# Patient Record
Sex: Male | Born: 1955 | Race: White | Hispanic: No | Marital: Married | Smoking: Former smoker
Health system: Southern US, Community
[De-identification: ages and names within clinical notes are randomized; demographics above are authoritative.]

## PROBLEM LIST (undated history)

## (undated) HISTORY — PX: APPENDECTOMY: SHX54

## (undated) HISTORY — PX: CHOLECYSTECTOMY: SHX55

## (undated) HISTORY — PX: KNEE SURGERY: SHX244

## (undated) HISTORY — PX: ROTATOR CUFF REPAIR: SHX139

---

## 1998-02-10 ENCOUNTER — Emergency Department (HOSPITAL_COMMUNITY): Admission: EM | Admit: 1998-02-10 | Discharge: 1998-02-10 | Payer: Self-pay | Admitting: Emergency Medicine

## 1999-10-28 ENCOUNTER — Encounter: Payer: Self-pay | Admitting: Emergency Medicine

## 1999-10-28 ENCOUNTER — Emergency Department (HOSPITAL_COMMUNITY): Admission: EM | Admit: 1999-10-28 | Discharge: 1999-10-28 | Payer: Self-pay | Admitting: Emergency Medicine

## 2000-04-19 ENCOUNTER — Emergency Department (HOSPITAL_COMMUNITY): Admission: EM | Admit: 2000-04-19 | Discharge: 2000-04-19 | Payer: Self-pay | Admitting: Emergency Medicine

## 2000-10-06 ENCOUNTER — Encounter: Payer: Self-pay | Admitting: Emergency Medicine

## 2000-10-06 ENCOUNTER — Emergency Department (HOSPITAL_COMMUNITY): Admission: EM | Admit: 2000-10-06 | Discharge: 2000-10-06 | Payer: Self-pay | Admitting: Emergency Medicine

## 2001-02-08 ENCOUNTER — Emergency Department (HOSPITAL_COMMUNITY): Admission: EM | Admit: 2001-02-08 | Discharge: 2001-02-08 | Payer: Self-pay | Admitting: Emergency Medicine

## 2001-02-10 ENCOUNTER — Emergency Department (HOSPITAL_COMMUNITY): Admission: EM | Admit: 2001-02-10 | Discharge: 2001-02-10 | Payer: Self-pay | Admitting: Emergency Medicine

## 2001-10-26 ENCOUNTER — Emergency Department (HOSPITAL_COMMUNITY): Admission: EM | Admit: 2001-10-26 | Discharge: 2001-10-26 | Payer: Self-pay

## 2001-12-14 ENCOUNTER — Emergency Department (HOSPITAL_COMMUNITY): Admission: EM | Admit: 2001-12-14 | Discharge: 2001-12-14 | Payer: Self-pay | Admitting: Emergency Medicine

## 2002-08-08 ENCOUNTER — Emergency Department (HOSPITAL_COMMUNITY): Admission: EM | Admit: 2002-08-08 | Discharge: 2002-08-08 | Payer: Self-pay | Admitting: Emergency Medicine

## 2002-08-29 ENCOUNTER — Encounter: Payer: Self-pay | Admitting: Emergency Medicine

## 2002-08-29 ENCOUNTER — Emergency Department (HOSPITAL_COMMUNITY): Admission: EM | Admit: 2002-08-29 | Discharge: 2002-08-29 | Payer: Self-pay | Admitting: Emergency Medicine

## 2003-03-06 ENCOUNTER — Encounter: Payer: Self-pay | Admitting: Gastroenterology

## 2003-03-06 ENCOUNTER — Ambulatory Visit (HOSPITAL_COMMUNITY): Admission: RE | Admit: 2003-03-06 | Discharge: 2003-03-06 | Payer: Self-pay | Admitting: Gastroenterology

## 2003-04-03 ENCOUNTER — Encounter: Payer: Self-pay | Admitting: Emergency Medicine

## 2003-04-03 ENCOUNTER — Emergency Department (HOSPITAL_COMMUNITY): Admission: EM | Admit: 2003-04-03 | Discharge: 2003-04-03 | Payer: Self-pay | Admitting: Emergency Medicine

## 2003-04-07 ENCOUNTER — Encounter: Payer: Self-pay | Admitting: Internal Medicine

## 2003-04-07 ENCOUNTER — Inpatient Hospital Stay (HOSPITAL_COMMUNITY): Admission: AD | Admit: 2003-04-07 | Discharge: 2003-04-10 | Payer: Self-pay | Admitting: Gastroenterology

## 2003-04-08 ENCOUNTER — Encounter (INDEPENDENT_AMBULATORY_CARE_PROVIDER_SITE_OTHER): Payer: Self-pay | Admitting: Specialist

## 2003-10-22 ENCOUNTER — Ambulatory Visit (HOSPITAL_COMMUNITY): Admission: RE | Admit: 2003-10-22 | Discharge: 2003-10-22 | Payer: Self-pay | Admitting: Gastroenterology

## 2003-12-17 ENCOUNTER — Ambulatory Visit (HOSPITAL_COMMUNITY): Admission: RE | Admit: 2003-12-17 | Discharge: 2003-12-17 | Payer: Self-pay | Admitting: Neurology

## 2004-08-04 ENCOUNTER — Ambulatory Visit (HOSPITAL_BASED_OUTPATIENT_CLINIC_OR_DEPARTMENT_OTHER): Admission: RE | Admit: 2004-08-04 | Discharge: 2004-08-04 | Payer: Self-pay | Admitting: Urology

## 2005-01-01 ENCOUNTER — Emergency Department (HOSPITAL_COMMUNITY): Admission: EM | Admit: 2005-01-01 | Discharge: 2005-01-02 | Payer: Self-pay | Admitting: Emergency Medicine

## 2005-09-14 ENCOUNTER — Emergency Department (HOSPITAL_COMMUNITY): Admission: EM | Admit: 2005-09-14 | Discharge: 2005-09-14 | Payer: Self-pay | Admitting: Emergency Medicine

## 2006-01-23 ENCOUNTER — Emergency Department (HOSPITAL_COMMUNITY): Admission: EM | Admit: 2006-01-23 | Discharge: 2006-01-23 | Payer: Self-pay | Admitting: Emergency Medicine

## 2006-05-31 ENCOUNTER — Emergency Department (HOSPITAL_COMMUNITY): Admission: EM | Admit: 2006-05-31 | Discharge: 2006-05-31 | Payer: Self-pay | Admitting: Family Medicine

## 2006-06-07 ENCOUNTER — Ambulatory Visit: Payer: Self-pay | Admitting: Family Medicine

## 2006-06-07 ENCOUNTER — Encounter: Admission: RE | Admit: 2006-06-07 | Discharge: 2006-06-07 | Payer: Self-pay | Admitting: Family Medicine

## 2006-07-26 ENCOUNTER — Ambulatory Visit: Payer: Self-pay | Admitting: Family Medicine

## 2006-08-29 ENCOUNTER — Ambulatory Visit (HOSPITAL_COMMUNITY): Admission: RE | Admit: 2006-08-29 | Discharge: 2006-08-29 | Payer: Self-pay | Admitting: Gastroenterology

## 2006-08-30 ENCOUNTER — Encounter (INDEPENDENT_AMBULATORY_CARE_PROVIDER_SITE_OTHER): Payer: Self-pay | Admitting: Specialist

## 2006-08-30 ENCOUNTER — Ambulatory Visit (HOSPITAL_COMMUNITY): Admission: RE | Admit: 2006-08-30 | Discharge: 2006-08-30 | Payer: Self-pay | Admitting: Gastroenterology

## 2006-10-09 ENCOUNTER — Encounter: Admission: RE | Admit: 2006-10-09 | Discharge: 2006-10-09 | Payer: Self-pay | Admitting: Gastroenterology

## 2006-11-27 ENCOUNTER — Ambulatory Visit: Payer: Self-pay | Admitting: Family Medicine

## 2007-03-16 ENCOUNTER — Emergency Department (HOSPITAL_COMMUNITY): Admission: EM | Admit: 2007-03-16 | Discharge: 2007-03-16 | Payer: Self-pay | Admitting: Emergency Medicine

## 2007-03-23 ENCOUNTER — Emergency Department (HOSPITAL_COMMUNITY): Admission: EM | Admit: 2007-03-23 | Discharge: 2007-03-23 | Payer: Self-pay | Admitting: Family Medicine

## 2008-04-02 ENCOUNTER — Emergency Department (HOSPITAL_COMMUNITY): Admission: EM | Admit: 2008-04-02 | Discharge: 2008-04-02 | Payer: Self-pay | Admitting: Family Medicine

## 2008-04-08 ENCOUNTER — Emergency Department (HOSPITAL_COMMUNITY): Admission: EM | Admit: 2008-04-08 | Discharge: 2008-04-08 | Payer: Self-pay | Admitting: Emergency Medicine

## 2009-04-02 ENCOUNTER — Emergency Department (HOSPITAL_COMMUNITY): Admission: EM | Admit: 2009-04-02 | Discharge: 2009-04-02 | Payer: Self-pay | Admitting: Emergency Medicine

## 2010-11-03 ENCOUNTER — Emergency Department (HOSPITAL_COMMUNITY)
Admission: EM | Admit: 2010-11-03 | Discharge: 2010-11-03 | Payer: Self-pay | Source: Home / Self Care | Admitting: Emergency Medicine

## 2011-01-17 LAB — POCT I-STAT, CHEM 8
BUN: 21 mg/dL (ref 6–23)
Chloride: 102 mEq/L (ref 96–112)
Creatinine, Ser: 1.1 mg/dL (ref 0.4–1.5)
Glucose, Bld: 98 mg/dL (ref 70–99)
Potassium: 4.2 mEq/L (ref 3.5–5.1)

## 2011-01-17 LAB — POCT URINALYSIS DIP (DEVICE)
Bilirubin Urine: NEGATIVE
Ketones, ur: 15 mg/dL — AB
Specific Gravity, Urine: 1.02 (ref 1.005–1.030)
pH: 5.5 (ref 5.0–8.0)

## 2011-02-25 NOTE — Op Note (Signed)
NAME:  Leonard Rowe, Leonard Rowe                        ACCOUNT NO.:  [id number]   MEDICAL RECORD NO.:  [id number]                   PATIENT TYPE:  AMB   LOCATION:  ENDO                                 FACILITY:  [facility]   PHYSICIAN:  Anselmo Rod, M.D.               DATE OF BIRTH:  1956-07-16   DATE OF PROCEDURE:  10/22/2003  DATE OF DISCHARGE:                                 OPERATIVE REPORT   PROCEDURE:  Colonoscopy.   ENDOSCOPIST:  Anselmo Rod, M.D.   INSTRUMENT USED:  Olympus video colonoscope.   INDICATIONS FOR PROCEDURE:  The patient is a 55 year old white male with a  history of rectal bleeding.  Rule out colonic polyps, masses, etc.   PRE-PROCEDURE PREPARATION:  Informed consent was procured from the patient.  The patient fasted for eight hours prior to the procedure and was prepped  with a bottle of magnesium citrate and a gallon of GoLYTELY the night prior  to the procedure.   PRE-PROCEDURE PHYSICAL:  VITAL SIGNS:  Stable.  NECK:  Supple.  CHEST:  Clear to auscultation.  ABDOMEN:  Soft with normal bowel sounds.   DESCRIPTION OF PROCEDURE:  The patient was placed in the left lateral  decubitus position and sedated with 100 mcg of Fentanyl and 10 mg of Versed  intravenously.  Once the patient was adequately sedated and maintained on  low flow oxygen and continuous cardiac monitoring, the Olympus video  colonoscope was advanced from the rectum to the cecum.  The appendiceal  orifice and ileocecal valve were clearly visualized and were normal to the  terminal ileum which appeared normal without lesions.  No masses, polyps,  erosions, ulcerations or diverticula were seen.  Small internal hemorrhoids  were recognized on retroflexion in the rectum.  The patient tolerated the  procedure well without complications.   IMPRESSION:  Normal colonoscopy up to the terminal ileum except for small  non-bleeding internal hemorrhoids.   RECOMMENDATIONS:  1. Continue on high fiber diet  with liberal fluid intake.  2. P.r.n. Phenergan for nausea and vomiting.  3. Outpatient follow-up in the next two weeks for further recommendations.  4. Repeat colorectal cancer screening is recommended in the next ten years     unless the patient develops any abnormal symptoms in the interim.                                               Anselmo Rod, M.D.    JNM/MEDQ  D:  10/22/2003  T:  10/22/2003  Job:  [id number]   cc:   Gabriel Earing, M.D.  [address]  [place]  [place] [address]  Fax: [phone]

## 2011-02-25 NOTE — Op Note (Signed)
NAME:  Leonard Rowe, Leonard Rowe              ACCOUNT NO.:  [id number]   MEDICAL RECORD NO.:  [id number]          PATIENT TYPE:  AMB   LOCATION:  NESC                         FACILITY:  [facility]   PHYSICIAN:  Maretta Bees. Vonita Moss, M.D.DATE OF BIRTH:  08/23/1956   DATE OF PROCEDURE:  08/04/2004  DATE OF DISCHARGE:                                 OPERATIVE REPORT   PREOPERATIVE DIAGNOSES:  Right spermatocele.   POSTOPERATIVE DIAGNOSES:  Right spermatocele.   PROCEDURES:  1.  Scrotal exploration.  2.  Right spermatocelectomy.  3.  Resection of tunica vaginalis.   SURGEON:  Dr. Larey Dresser   ANESTHESIA:  General.   INDICATIONS:  This 55 year old gentleman has had a tender, sore, cystic mass  in the right scrotum that was apparently excised in [place] last year,  and he did well for several months, and now he has a recurrent cystic lesion  documented on ultrasound as measuring 2.8 x 2.0 x 4.2 cm, located above the  right testicle.  Testicles were normal on ultrasound.  This area was very  tender, and the patient wished excision.   DESCRIPTION OF PROCEDURE:  The patient brought to the operating room and  placed in supine position.  The external genitalia were prepped and draped  in the usual fashion.  A vertical right scrotal incision was made, and there  was mild scarring from previous surgery, but I was able to dissect out this  cystic mass above the right testicle and isolate it and resect it  completely.  It had quite typical appearance of a spermatocele.  A small  residual base was fulgurated to prevent recurrence.  I then dissected a very  tightly adherent tunica vaginalis on the testicle to prevent future  formation of a hydrocele.  At this point, a couple of bleeders were tied off  with 3-0 chromic catgut.  The small bleeders were fulgurated, and there was  good hemostasis with essentially no blood loss.  The testicle was placed  back in the scrotum and scrotal incision closed in two  layers with running 3-  0 chromic catgut.  The wound was cleaned and dressed with dry sterile gauze  dressings.  He was taken to the recovery room in good condition having  tolerated the procedure well with a correct sponge, needle, and instrument  count.      LJP/MEDQ  D:  08/04/2004  T:  08/04/2004  Job:  [id number]

## 2011-02-25 NOTE — Op Note (Signed)
NAME:  Leonard Rowe, Leonard Rowe                        ACCOUNT NO.:  [id number]   MEDICAL RECORD NO.:  [id number]                   PATIENT TYPE:  OBV   LOCATION:  5710                                 FACILITY:  [facility]   PHYSICIAN:  Jimmye Norman III, M.D.               DATE OF BIRTH:  09/07/1956   DATE OF PROCEDURE:  04/08/2003  DATE OF DISCHARGE:                                 OPERATIVE REPORT   PREOPERATIVE DIAGNOSES:  Right upper quadrant pain, possible chronic  cholecystitis.   POSTOPERATIVE DIAGNOSES:  Right upper quadrant pain, possible chronic  cholecystitis.   OPERATION PERFORMED:  1. Laparoscopic cholecystectomy.  2. Liver biopsy.   SURGEON:  Marta Lamas. Lindie Spruce, M.D.   ASSISTANT:  Gabrielle Dare. Janee Morn, M.D.   ANESTHESIA:  General endotracheal.   ESTIMATED BLOOD LOSS:  Less than 20mL.   COMPLICATIONS:  None.   CONDITION:  Stable.   SPECIMENS:  1. Gallbladder.  2. Liver biopsy.   INDICATIONS FOR PROCEDURE:  The patient is a 55 year old with chronic right  upper quadrant abdominal pain for several months now after an extensive and  exhaustive work-up is coming for a laparoscopic cholecystectomy.   OPERATIVE FINDINGS:  The patient has adhesions towards the gallbladder bed  and the right lobe of the liver.  He also has some scarring in the liver  just above the gallbladder fossa.  Biopsies were taken.   DESCRIPTION OF PROCEDURE:  The patient was taken to the operating room and  placed on the table in the supine position.  After an adequate endotracheal  anesthetic was administered, the patient was prepped and draped in the usual  sterile manner exposing the midline and the right upper quadrant.   A supraumbilical curvilinear incision was made using a #11 blade and taken  down to the midline fascia.  It was through this fascia that a Veress needle  was passed into the peritoneal cavity and confirmed to be in position using  the saline test.  Once this was done, carbon  dioxide insufflation up to a  maximum intra-abdominal pressure of 15 mmHg was passed into the peritoneal  cavity.  Once this was done, 11-12 mm cannula and two right costal margin 5  mm cannulas were passed under direct vision.  A subxiphoid cannula was also  passed.  Once this was done, the patient was placed in steep reverse  Trendelenburg position, the left side tilted down and the gallbladder  dissection begun.   It was noted immediately that the patient had some scarring above the  gallbladder fossa.  In a standard manner, we grasped the gallbladder and the  infundibulum with ratcheted grasper through the right costal margin  cannulas.  We dissected out the perineum overlying the triangle of Calot and  the hepatoduodenal triangle.  The cystic duct was noted to be extremely  small, less than 2 mm in size and did not feel as  though it was safe to  cannulate.  It was too small for __________ catheter and therefore, no  attempt was made to cannulate it.  We ligated it doubly distally and singly  proximally and then subsequently transected it.  The cystic artery was  subsequently found and ligated with Endoclips  We then dissected the  gallbladder out of its bed with minimal difficulty.  As we got to the edge  of the liver, we took out a wedge biopsy of the liver edge with the scarring  contained within and placed it in the EndoCatch bag which was subsequently  brought out through the supraumbilical fascia.   Once the gallbladder was removed with minimal difficulty, we allowed all gas  to escape through the cannula sites and also aspirated along the right upper  quadrant.  Once this was done, we closed the supraumbilical fascia using a  figure-of-eight stitch of 0 Vicryl.  Once this was done, the skin at all  sites was closed using running subcuticular stitch of 4-0 Vicryl.  Steri-  Strips were applied to all wounds.  0.25% Marcaine with epinephrine was  injected at all sites.  All  sponge, needle and instrument counts were  correct.                                                 [name], M.D.    JW/MEDQ  D:  04/08/2003  T:  04/08/2003  Job:  [id number]   cc:   Anselmo Rod, M.D.  [address]  [address]  [place]  [place] [address]  Fax: [phone]   C. Ulyess Mort, M.D.  [address]  [place]  [place] [address]  Fax: [phone]

## 2011-02-25 NOTE — Discharge Summary (Signed)
NAME:  Leonard Rowe, Leonard Rowe                        ACCOUNT NO.:  [id number]   MEDICAL RECORD NO.:  [id number]                   PATIENT TYPE:  INP   LOCATION:  5710                                 FACILITY:  [facility]   PHYSICIAN:  Jimmye Norman III, M.D.               DATE OF BIRTH:  01-28-1956   DATE OF ADMISSION:  04/07/2003  DATE OF DISCHARGE:  04/10/2003                                 DISCHARGE SUMMARY   DISCHARGE DIAGNOSIS:  Abnormal scarring of the liver, unknown etiology with  normal gallbladder status post laparoscopic cholecystectomy and persists  right upper quadrant pain.   PRINCIPAL PROCEDURES:  1. Upper GI endoscopy.  2. Laparoscopic cholecystectomy.   DISCHARGE MEDICATIONS:  He is discharged home with Percocet p.r.n. for pain.   FOLLOW UP:  See me in 2 weeks.   DIET:  Unrestricted.   CONDITION:  Good.   BRIEF SUMMARY OF HOSPITAL COURSE:  The patient was reviewed to me by Dr.  Loreta Ave who had concerns for this patient having a severe abdominal pain in the  right upper quadrant causing nausea, vomiting, and just chronic illness.  He  had had a normal HIDA scan on 2 different occasions with no evidence of  common bile duct obstruction or abnormal contraction of the gallbladder.  However, his symptoms were very close to what a person would have with an  abnormal gallbladder with right upper quadrant postprandial pain associated  with nausea, vomiting, bloating, and gaseousness.   Based on his symptoms primarily, we elected to go ahead with a laparoscopic  cholecystectomy.  This was done.  However, the patient did get a  preoperative upper GI endoscopy, which was normal.   Laparoscopically, the patient's gallbladder was taken out and was found to  be normal pathologically, but he did have some abnormal scarring in and near  the gallbladder fossa.  He also had some adhesions to the gallbladder fossa  and the gallbladder, which were taken down.  In spite of this, it did not  seem to relieve the patient's preoperative pain.  However, he did well from  the surgery and was discharged home.   Of note, his bilirubin, which had been slightly increased on several  occasions preoperative up to 1.8 also went up to 1.8 postoperative, but his  alkaline phosphatase and other LFTs were normal.   I have discharged him home.  Come back to see me in 2 weeks.                                                [name], M.D.    JW/MEDQ  D:  04/17/2003  T:  04/18/2003  Job:  [id number]   cc:   Anselmo Rod, M.D.  [address]  [address]  [place]  [place] [address]  Fax: [phone]   C. Ulyess Mort, M.D.  [address]  [place]  [place] [address]  Fax: [phone]    cc:   Anselmo Rod, M.D.  [address]  [address]  [place]  [place] [address]  Fax: [phone]   C. Ulyess Mort, M.D.  [address]  [place]  [place] [address]  Fax: [phone]

## 2011-02-25 NOTE — Op Note (Signed)
NAME:  Leonard Rowe, Leonard Rowe                        ACCOUNT NO.:  [id number]   MEDICAL RECORD NO.:  [id number]                   PATIENT TYPE:  OBV   LOCATION:  5710                                 FACILITY:  [facility]   PHYSICIAN:  Anselmo Rod, M.D.               DATE OF BIRTH:  [birth year]   DATE OF PROCEDURE:  04/07/2003  DATE OF DISCHARGE:                                 OPERATIVE REPORT   PROCEDURE PERFORMED:  Esophagogastroduodenoscopy.   ENDOSCOPIST:  Anselmo Rod, M.D.   INSTRUMENT USED:  Olympus video panendoscopy   INDICATIONS FOR PROCEDURE:  Severe epigastric and right upper quadrant pain  in a 55 year old white male with a normal abdominal ultrasound and a HIDA  scan revealing EF of 54%, rule out peptic ulcer disease, esophagitis,  gastritis, etc.  The patient has a history of nonsteroidal use including  Goody's powders.   PREPROCEDURE PREPARATION:  Informed consent was procured from the patient.  The patient was fasted for eight hours prior to the procedure.   PREPROCEDURE PHYSICAL:  VITAL SIGNS:  The patient had stable vital signs.  NECK:  Supple.  CHEST:  Clear to auscultation.  S1 and S2 regular.  ABDOMEN:  Soft with normal bowel sounds.  Significant epigastric and right  upper quadrant tenderness on palpation with regarding.  No rebound or  hepatosplenomegaly.   DESCRIPTION OF PROCEDURE:  The patient was placed in the left lateral  decubitus position and sedated with 75 mcg of Fentanyl and 6 mg of Versed  intravenously.  Once the patient was adequately sedated and maintained on  low flow oxygen and continuous cardiac monitoring, the Olympus video  panendoscope was advanced through the mouth piece over the tongue into the  esophagus under direct vision.  The entire esophagus appeared normal with no  evidence of ring, stricture, masses, esophagitis or Barrett's mucosa.  The  scope was then advanced into the stomach.  The gastric mucosa appeared  normal except  for a small subcutaneous nodule noticed in the antrum.  Attempts were made to biopsy this region but it seemed to slip below the  overlying gastric mucosa and therefore this area was not biopsied.  Retroflexion of the high cardia revealed no abnormalities.  The duodenal  bulb and the proximal wall distal to the bulb appeared normal.  There was no  outlet obstruction.   IMPRESSION:  Normal esophagogastroduodenoscopy except for small submucosal  nodule in antrum of unclear significance.   RECOMMENDATIONS:  1. Surgical evaluation by Dr. Lindie Spruce for possible laparoscopic     cholecystectomy.  2. Continue PPI.  3. Avoid all nonsteroidals.  4. Further recommendations will be made as per the teaching service.  Anselmo Rod, M.D.    JNM/MEDQ  D:  04/07/2003  T:  04/08/2003  Job:  [id number]   cc:   Gabriel Earing, M.D.  [address]  [place]  [place] [address]  Fax: [phone]   C. Ulyess Mort, M.D.  [address]  [place]  [place] [address]  Fax: [phone]   Jimmye Norman III, M.D.  [phone]  [place]  [place] [address]  Fax: [phone]

## 2011-02-25 NOTE — Consult Note (Signed)
NAME:  Leonard Rowe, Leonard Rowe                        ACCOUNT NO.:  [id number]   MEDICAL RECORD NO.:  [id number]                   PATIENT TYPE:  OBV   LOCATION:  5710                                 FACILITY:  [facility]   PHYSICIAN:  Jimmye Norman III, M.D.               DATE OF BIRTH:  [birth year]   DATE OF CONSULTATION:  DATE OF DISCHARGE:                                   CONSULTATION   Dear Dr. Loreta Ave:   Thank you very much for asking me to see Leonard Rowe, a gentleman who is 55 years old with a four-month history of abdominal pain in the right upper  quadrant associated with nausea and vomiting.  He has had occasional  temperatures also.  He has been through an extensive work-up for this  process including an upper GI endoscopy, a CT scan at an outside hospital,  none of which have demonstrated any specific abnormalities.  He had a HIDA  scan also performed twice.  The last one did not show any evidence of common  duct filling, however, there was cystic duct filling.  The gallbladder did  fill.  He had ejection fraction of 54% on a previous HIDA scan.   The pain usually gets worse after he eats hamburgers, hot dogs, and french  fries.  This was all voluntarily given to me as information.  It usually  comes late in the night time.  He has a history of peptic ulcer disease and  he takes Prevacid for that.   After an extensive work-up it is felt that a cholecystectomy is probably the  only thing that will help this gentleman and he comes in now for evaluation  and preoperative screening for laparoscopic cholecystectomy.   PAST MEDICAL HISTORY:  This is very unremarkable.  He has no history of  coronary disease, pulmonary disease, renal disease or liver disease.  He did  use to drink heavily, no longer drinks anymore.   PAST SURGICAL HISTORY:  He had shoulder surgery, knee surgery, tonsils and  adenoids and also had a spermatocele removed by Dr. Pete Glatter in [place],  [place].   FAMILY HISTORY:  His father is still alive, had CABG four years ago x4.  Mother died of emphysema.  He has two brothers who are healthy but two have  ulcer disease.  No history of types of cancer.   REVIEW OF SYSTEMS:  He has had some blurred vision, polyuria, goes to the  bathroom frequently but this has improved since his spermatocele was  removed.  He had some rectal bleeding prior but likely secondary to  hemorrhoids.  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  He quit smoking but did get up to four cigarettes a day.  He  use to drink a couple of beers a day.  No other relevant history.   PHYSICAL EXAMINATION:  GENERAL APPEARANCE:  He looks uncomfortable at rest  and does appear to have some discomfort.  VITAL SIGNS:  He is afebrile.  Vital signs are stable.  HEENT:  He is normocephalic and atraumatic.  He does not seem to have an  scleral icterus.  NECK:  Supple.  No palpable masses.  CHEST:  Clear to auscultation.  CARDIOVASCULAR:  He has a regular rate and rhythm with no murmurs, gallops,  lifts or heaves.  ABDOMEN:  He has normal active bowel sounds, no palpable masses, no palpable  spleen or liver.  He has no rebound but does guard toward the right upper  quadrant and the right flank.  He has good bowel sounds.  RECTAL:  Examination was deferred.   LABORATORY DATA:  Laboratory studies from the 24th were all normal with the  exception of a total bilirubin at 1.8 which is higher than the previous  bilirubin done which was at 1.5.   IMPRESSION:  After revealing all of his x-rays and laboratory studies that  the patient possibly has symptomatic gallbladder disease, chronic  cholecystitis and maybe undetectable stones that are causing him to have  recurrent right upper quadrant pain associated nausea and vomiting in a  postprandial manner.  Because of  this possibility, I think that a  laparoscopic cholecystectomy is in order for therapy and possibly diagnostic  for his current process.  This will be done within the next 24 to 48 hours.  We discussed the risks and benefits with the patient and his wife and also  with the referring physician and they all understand and agree with the  current plan.                                               [name], M.D.    JW/MEDQ  D:  04/07/2003  T:  04/07/2003  Job:  [id number]   cc:   [facility] Teaching Service

## 2011-02-25 NOTE — Op Note (Signed)
NAME:  Leonard Rowe, Leonard Rowe              ACCOUNT NO.:  [id number]   MEDICAL RECORD NO.:  [id number]          PATIENT TYPE:  AMB   LOCATION:  ENDO                         FACILITY:  [facility]   PHYSICIAN:  Anselmo Rod, M.D.  DATE OF BIRTH:  [birth year]   DATE OF PROCEDURE:  08/30/2006  DATE OF DISCHARGE:                                 OPERATIVE REPORT   PROCEDURE PERFORMED:  Esophagogastroduodenoscopy with antral biopsies.   ENDOSCOPIST:  Anselmo Rod, M.D.   INSTRUMENT USED:  Olympus video panendoscope.   INDICATIONS FOR PROCEDURE:  The patient is a 55 year old white male with a  history of severe epigastric and right upper quadrant pain, undergoing  esophagogastroduodenoscopy to rule out peptic ulcer disease, esophagitis,  gastritis, etc.  The patient has had a cholecystectomy in the past.   PREPROCEDURE PREPARATION:  Informed consent was procured from the patient.  The patient was fasted for eight hours prior to the procedure.  Risks and  benefits of the procedure were discussed with him in great detail.   PREPROCEDURE PHYSICAL:  The patient had stable vital signs.  Neck supple,  chest clear to auscultation.  S1, S2 regular.  Abdomen soft with normal  bowel sounds.  Epigastric and right upper quadrant tenderness on palpation  with guarding.  No rebound, no rigidity, no hepatosplenomegaly.   DESCRIPTION OF PROCEDURE:  The patient was placed in the left lateral  decubitus position and sedated with 100 mcg of fentanyl and 10 mg of Versed  given intravenously in slow incremental doses.  Once the patient was  adequately sedated and maintained on low-flow oxygen and continuous cardiac  monitoring, the Olympus video panendoscope was advanced through the mouth  piece over the tongue into the esophagus under direct vision.  The entire  esophagus appeared normal except for mild grade 1 distal esophagitis close  to the Z-line.  There was no evidence Barrett's mucosa or stricture.  Diffuse  gastritis with old heme was noted in the antrum and the midbody of  the stomach.  A submucosal nodule was appreciated.  I tried to biopsy this  lesion but the mucosa overlying the nodule was mobile.  Therefore no  biopsies of this lesion were done.  The proximal small bowel appeared  normal.  Antral biopsies were done to rule out presence of Helicobacter  pylori by pathology.  There was no evidence of a hiatal hernia on high  retroflexion.  The patient tolerated the procedure well without  complications.   IMPRESSION:  1. Mild grade 1 distal esophagitis.  2. Diffuse gastritis with old heme in the antrum.  Biopsies done for      Helicobacter pylori.  3. No evidence of a hiatal hernia on high retroflexion.  4. Submucosal nodule present.  5. Normal proximal small bowel.   RECOMMENDATIONS:  1. Await pathology results.  2. Continue proton pump inhibitor.  3. Treat with antibiotics if Helicobacter pylori present on biopsies.  4. Endoscopic ultrasound in the future to further evaluate the nature of      the submucosal nodule in the antrum.  Further recommendations made in  follow-up.      Anselmo Rod, M.D.  Electronically Signed     JNM/MEDQ  D:  08/30/2006  T:  08/30/2006  Job:  [id number]   cc:   Sharlot Gowda, M.D.

## 2011-12-01 ENCOUNTER — Observation Stay (HOSPITAL_COMMUNITY)
Admission: EM | Admit: 2011-12-01 | Discharge: 2011-12-02 | Disposition: A | Discharge status: Home Health | Payer: Self-pay | Attending: Emergency Medicine | Admitting: Emergency Medicine

## 2011-12-01 ENCOUNTER — Other Ambulatory Visit: Payer: Self-pay

## 2011-12-01 ENCOUNTER — Encounter (HOSPITAL_COMMUNITY): Payer: Self-pay | Admitting: *Deleted

## 2011-12-01 DIAGNOSIS — L089 Local infection of the skin and subcutaneous tissue, unspecified: Secondary | ICD-10-CM | POA: Insufficient documentation

## 2011-12-01 DIAGNOSIS — Y998 Other external cause status: Secondary | ICD-10-CM | POA: Insufficient documentation

## 2011-12-01 DIAGNOSIS — L0390 Cellulitis, unspecified: Secondary | ICD-10-CM

## 2011-12-01 DIAGNOSIS — Y92009 Unspecified place in unspecified non-institutional (private) residence as the place of occurrence of the external cause: Secondary | ICD-10-CM | POA: Insufficient documentation

## 2011-12-01 DIAGNOSIS — IMO0002 Reserved for concepts with insufficient information to code with codable children: Principal | ICD-10-CM | POA: Insufficient documentation

## 2011-12-01 NOTE — ED Notes (Signed)
Pt is here with left arm pain.  Elbow has been hurting for 3 weeks but this morning the pain was much more severe.  Pt states that he has numbness and tingling in his left arm (feels like its asleep).  Pt also notes a knot in the antecubital area which is really sore (this began today) and pt wonders if its a blood clot.  No CP or sob but pt had a couple of brief episodes where he was lightheaded

## 2011-12-02 ENCOUNTER — Emergency Department (HOSPITAL_COMMUNITY): Payer: Self-pay

## 2011-12-02 ENCOUNTER — Observation Stay (HOSPITAL_COMMUNITY): Payer: Self-pay

## 2011-12-02 DIAGNOSIS — M79609 Pain in unspecified limb: Secondary | ICD-10-CM

## 2011-12-02 LAB — BASIC METABOLIC PANEL
BUN: 11 mg/dL (ref 6–23)
CO2: 27 mEq/L (ref 19–32)
GFR calc non Af Amer: >90 mL/min (ref >90)
Glucose, Bld: 123 mg/dL — ABNORMAL HIGH (ref 70–99)
Potassium: 3.5 mEq/L (ref 3.5–5.1)

## 2011-12-02 LAB — DIFFERENTIAL
Basophils Relative: 2 % — ABNORMAL HIGH (ref 0–1)
Eosinophils Absolute: 0.5 10*3/uL (ref 0.0–0.7)
Eosinophils Relative: 6 % — ABNORMAL HIGH (ref 0–5)
Monocytes Absolute: 0.6 10*3/uL (ref 0.1–1.0)
Neutro Abs: 3.5 10*3/uL (ref 1.7–7.7)

## 2011-12-02 LAB — CBC
HCT: 38.8 % — ABNORMAL LOW (ref 39.0–52.0)
Hemoglobin: 13.3 g/dL (ref 13.0–17.0)
MCH: 31.5 pg (ref 26.0–34.0)
MCHC: 34.3 g/dL (ref 30.0–36.0)
RBC: 4.22 MIL/uL (ref 4.22–5.81)

## 2011-12-02 MED ORDER — MORPHINE SULFATE 4 MG/ML IJ SOLN
4.0000 mg | INTRAMUSCULAR | Status: DC | PRN
  Administered 2011-12-02: 4 mg via INTRAVENOUS
  Filled 2011-12-02: qty 1

## 2011-12-02 MED ORDER — ONDANSETRON HCL 4 MG/2ML IJ SOLN
4.0000 mg | Freq: Three times a day (TID) | INTRAMUSCULAR | Status: DC | PRN
  Administered 2011-12-02: 4 mg via INTRAVENOUS
  Filled 2011-12-02: qty 2

## 2011-12-02 MED ORDER — CLINDAMYCIN PHOSPHATE 600 MG/50ML IV SOLN
600.0000 mg | Freq: Three times a day (TID) | INTRAVENOUS | Status: DC
  Administered 2011-12-02: 600 mg via INTRAVENOUS
  Filled 2011-12-02: qty 50

## 2011-12-02 MED ORDER — TETANUS-DIPHTH-ACELL PERTUSSIS 5-2.5-18.5 LF-MCG/0.5 IM SUSP
0.5000 mL | Freq: Once | INTRAMUSCULAR | Status: AC
  Administered 2011-12-02: 0.5 mL via INTRAMUSCULAR
  Filled 2011-12-02: qty 0.5

## 2011-12-02 MED ORDER — IBUPROFEN 800 MG PO TABS: 800.0000 mg | ORAL_TABLET | Freq: Three times a day (TID) | ORAL | Status: DC | PRN

## 2011-12-02 MED ORDER — PROMETHAZINE HCL 25 MG PO TABS: 25.0000 mg | ORAL_TABLET | Freq: Three times a day (TID) | ORAL | Status: DC | PRN

## 2011-12-02 MED ORDER — SODIUM CHLORIDE 0.9 % IV SOLN
1000.0000 mL | INTRAVENOUS | Status: DC
  Administered 2011-12-02: 1000 mL via INTRAVENOUS

## 2011-12-02 MED ORDER — PERCOCET 5-325 MG PO TABS: 1.0000 | ORAL_TABLET | Freq: Four times a day (QID) | ORAL | Status: DC | PRN

## 2011-12-02 MED ORDER — CEPHALEXIN 500 MG PO CAPS: 500.0000 mg | ORAL_CAPSULE | Freq: Four times a day (QID) | ORAL | Status: DC

## 2011-12-02 MED ORDER — CLINDAMYCIN PHOSPHATE 600 MG/50ML IV SOLN
600.0000 mg | Freq: Once | INTRAVENOUS | Status: AC
  Administered 2011-12-02: 600 mg via INTRAVENOUS
  Filled 2011-12-02: qty 50

## 2011-12-02 MED ORDER — PROMETHAZINE HCL 25 MG/ML IJ SOLN
25.0000 mg | INTRAMUSCULAR | Status: AC
  Administered 2011-12-02: 25 mg via INTRAVENOUS
  Filled 2011-12-02: qty 1
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
## 2011-12-02 NOTE — ED Notes (Signed)
Vascular surgery here to see pt 

## 2011-12-02 NOTE — ED Provider Notes (Signed)
Patient got a small piece of metal embedded into left forearm 7 days ago while chiseling. Seen by Dr. Doylene Canard last night who consult in Dr. Kelly Splinter for removal of foreign body he presently complains of pain proximal to the wound and pain at left lateral chest midaxillary line worse with palpation no distress On exam patient is alert nontoxic lungs clear to auscultation left upper extremity not red or warm or swollen left proximal forearm is tender at left upper arm is tender, there is no redness and left lateral chest is tender reproducing pain exactly radial pulses 2+ good capillary refill. Basilic vein is tender and distended. I spoke with Dr. Kelly Splinter who suggests the patient may need to be explored Spoke with Dr.Dickson, from vascular surgery who will come to evaluate patient in the emergency department  Doug Sou, MD 12/02/11 [phone]

## 2011-12-02 NOTE — ED Notes (Signed)
Antibiotics infused.  Await removal of metal from arm.  Pt does not want pain meds at this time.

## 2011-12-02 NOTE — ED Provider Notes (Signed)
7:34 AM Patient is in CDU under observation, cellulitis protocol.  Patient with continued pain this morning and nausea from pain medication, received medications minutes before I saw him.  States that he now has pain in his left back, and thinks the red streak on his arm is worse than yesterday.  On exam, pt is A&Ox4, NAD, RRR, CTAB, left forearm with bandage in place, erythematous streak starting over volar aspect extending up arm.  Pt's arm and axilla are tender to palpation.  Pt continues on protocol receiving IV clindamycin. Plan is for Dr Kelly Splinter (plastic surgery) to see patient in CDU this morning.  Will continue to follow.   8:20 AM I have spoken with Dr Kelly Splinter who has seen the patient.  She is concerned that there might be a second foreign body that has traveled up or injured the vein of the left arm.  Suggests xray of upper arm and chest to r/o secondary foreign body. Patient continues to have nausea, have ordered phenergan.  Continues to have pain, now states pain is in his upper abdomen.  I have discussed this with Dr Ethelda Chick, who will see the patient and call the vascular surgeon on call.  I have confirmed with radiology that a DG humerus and chest 2 view will include all space from the elbow through the chest.   Patient declines further pain medication as it is making him nauseated.    9:45 AM Vascular surgeon is currently with the patient.    11:40 AM Doppler US is negative.    1:13 PM I have spoken with Dr Durwin Nora who states he does not need to see patient as outpatient given negative doppler. Recommends PO abx for cellulitis and PCP follow up.  I have discussed plan with Dr Ethelda Chick who agrees with d/c home and keflex, recheck in 2 days.  Patient verbalizes understanding and agrees with plan.    Leonard Rowe [place], [place] 12/02/11 [phone]

## 2011-12-02 NOTE — ED Notes (Signed)
Redness to L arm minimized. MD to come in am to remove metal fragment from L arm.  Report given to RN Lana.

## 2011-12-02 NOTE — Progress Notes (Signed)
Observation review is complete. 

## 2011-12-02 NOTE — ED Provider Notes (Signed)
History     CSN: [id number]  Arrival date & time 12/01/11  2255   First MD Initiated Contact with Patient 12/02/11 0000      Chief Complaint  Patient presents with  . Arm Pain    (Consider location/radiation/quality/duration/timing/severity/associated sxs/prior treatment) HPI Comments: Patient presents for evaluation of left forearm pain which has become progressively worse over the past several weeks.  He describes hitting a chisel with a hammer as he was trying to break more for off bricks about 2 weeks ago and a piece of metal flew off of the chisel and hit his left volar forearm.  He had a small puncture which he states bled for several hours but finally stopped, but has felt a small object at this site and was suspicious for a metal retained foreign body.  Over the past 24 hours he is developed increased pain and swelling at the site and notes pain that radiates into his left axilla, worse with palpation and worse with extension and flexion of his elbow and shoulder.  He denies fevers and chills.  He has a red streak going up his left arm which he actually did not notice until after arrival here.  He also does mention soreness at his left medial elbow which has been persistent since he has been doing this for work, and with which is temporarily relieved with Aleve.  Patient is a 56 y.o. male presenting with arm pain. The history is provided by the patient.  Arm Pain This is a new problem. Pertinent negatives include no abdominal pain, arthralgias, chest pain, congestion, fatigue, fever, headaches, joint swelling, nausea, neck pain, numbness, rash, sore throat or weakness.    History reviewed. No pertinent past medical history.  Past Surgical History  Procedure Date  . Rotator cuff repair   . Cholecystectomy   . Knee surgery     No family history on file.  History  Substance Use Topics  . Smoking status: Not on file  . Smokeless tobacco: Not on file  . Alcohol Use: Yes       Review of Systems  Constitutional: Negative for fever and fatigue.  HENT: Negative for congestion, sore throat and neck pain.   Eyes: Negative.   Respiratory: Negative for chest tightness and shortness of breath.   Cardiovascular: Negative for chest pain.  Gastrointestinal: Negative for nausea and abdominal pain.  Genitourinary: Negative.   Musculoskeletal: Negative for joint swelling and arthralgias.  Skin: Positive for color change and wound. Negative for rash.  Neurological: Negative for dizziness, weakness, light-headedness, numbness and headaches.  Hematological: Negative.   Psychiatric/Behavioral: Negative.     Allergies  Codeine  Home Medications   Current Outpatient Rx  Name Route Sig Dispense Refill  . CEPHALEXIN 500 MG PO CAPS Oral Take 1 capsule (500 mg total) by mouth 4 (four) times daily. 40 capsule 0  . IBUPROFEN 800 MG PO TABS Oral Take 1 tablet (800 mg total) by mouth every 8 (eight) hours as needed for pain. 21 tablet 0  . PERCOCET 5-325 MG PO TABS Oral Take 1 tablet by mouth every 6 (six) hours as needed for pain. 15 tablet 0    Dispense as written.  Marland Kitchen PROMETHAZINE HCL 25 MG PO TABS Oral Take 1 tablet (25 mg total) by mouth every 8 (eight) hours as needed for nausea. 15 tablet 0    BP 140/86  Pulse 67  Temp(Src) 98.2 F (36.8 C) (Oral)  Resp 20  SpO2 100%  Physical  Exam  Nursing note and vitals reviewed. Constitutional: He is oriented to person, place, and time. He appears well-developed and well-nourished.  HENT:  Head: Normocephalic and atraumatic.  Eyes: Conjunctivae are normal.  Neck: Normal range of motion.  Cardiovascular: Normal rate, regular rhythm, normal heart sounds and intact distal pulses.   Pulmonary/Chest: Effort normal and breath sounds normal. He has no wheezes.  Abdominal: Soft. Bowel sounds are normal. There is no tenderness.  Musculoskeletal: Normal range of motion. He exhibits edema and tenderness.       Left forearm: He  exhibits tenderness and swelling.       Arms:      Approximate 2 cm area of increased tenderness and swelling left volar mid forearm with small central field puncture.  Red streaking from the site to left axilla. No significant increased warmth of this extremity.  Tender to palpation in the left axilla with no appreciable adenopathy present.  Also tender to palpation at left elbow medial epicondyle without edema, no crepitus of elbow.  Neurological: He is alert and oriented to person, place, and time.  Skin: Skin is warm and dry. There is erythema.  Psychiatric: He has a normal mood and affect.    ED Course  Procedures (including critical care time)  Labs Reviewed  CBC - Abnormal; Notable for the following:    HCT 38.8 (*)    All other components within normal limits  DIFFERENTIAL - Abnormal; Notable for the following:    Neutrophils Relative 39 (*)    Eosinophils Relative 6 (*)    Basophils Relative 2 (*)    Lymphs Abs 4.2 (*)    Basophils Absolute 0.2 (*)    All other components within normal limits  BASIC METABOLIC PANEL - Abnormal; Notable for the following:    Glucose, Bld 123 (*)    All other components within normal limits   Dg Chest 2 View  12/02/2011  *RADIOLOGY REPORT*  Clinical Data: Chest and upper arm pain.  CHEST - 2 VIEW  Comparison: Chest x-ray 11/03/2010.  Findings: Lung volumes are normal.  No consolidative airspace disease.  No pleural effusions.  No pneumothorax.  No pulmonary nodule or mass noted.  Pulmonary vasculature and the cardiomediastinal silhouette are within normal limits.  Surgical clips projecting over the right upper quadrant of the abdomen, likely from prior cholecystectomy.  IMPRESSION: 1. No radiographic evidence of acute cardiopulmonary disease. 2.  Postsurgical changes suggestive of prior cholecystectomy.  Original Report Authenticated By: Florencia Reasons, M.D.   Dg Forearm Left  12/02/2011  *RADIOLOGY REPORT*  Clinical Data: Left forearm pain,  with distension of venous structures, extending to the axilla.  Concern for possible metallic foreign body; injury to mid forearm from metal welding tool 2 weeks ago.  LEFT FOREARM - 2 VIEW  Comparison: None.  Findings: There is an irregular tiny 3 mm piece of metal noted within the superficial soft tissues along the volar aspect of the mid forearm.  This is seen along the superficial aspect of the musculature, approximately 6 mm deep to the skin surface.  No additional radiopaque foreign bodies are seen.  There is question of mild volar soft tissue swelling.  There is no evidence of osseous disruption.  The elbow joint is grossly unremarkable in appearance.  No elbow joint effusion is identified.  The carpal rows appear intact, and demonstrate normal alignment.  Negative ulnar variance is noted.  Visualized joint spaces are preserved.  IMPRESSION: Irregular tiny 3 mm piece of  metal noted at the superficial musculature along the volar aspect of the mid forearm, approximately 6 mm deep to the skin surface.  Original Report Authenticated By: Tonia Ghent, M.D.   Dg Humerus Left  12/02/2011  *RADIOLOGY REPORT*  Clinical Data: The prior arm pain evaluate for foreign body  LEFT HUMERUS - 2+ VIEW  Comparison: None  Findings: There is a suture anchor identified in the region of the greater tuberosity of the proximal left humerus.  There are no acute fractures or subluxations identified.  There are no abnormal radio-opaque foreign bodies or soft tissue calcifications.  IMPRESSION:  1. No abnormal radiopaque foreign bodies identified. 2.  Suture anchor identified within the proximal left humerus  Original Report Authenticated By: Rosealee Albee, M.D.     1. Cellulitis     Procedure: After cleansing left volar forearm skin with Betadine and sterile saline, 0.5 CM incision with #11 blade with attempt to remove foreign body as seen on x-ray.  Unable to appreciate such foreign body despite probing and flushing with  sterile saline.  Patient tolerated procedure well.  IV clindamycin 600 mg IV given. MDM  Dr. Doylene Canard also saw patient with attempt to localize foreign body without success.  Patient will be placed in CDU on cellulitis protocol.  He also contacted Dr. Shella Spearing who will consult patient in a.m. with attempts to locate foreign body.        Candis Musa, PA 12/02/11 1319

## 2011-12-02 NOTE — ED Notes (Signed)
DR Evette Georges HERE TO REEVAL PT AND REVIEW RESULTS OF VASCULAR STUDY

## 2011-12-02 NOTE — Progress Notes (Signed)
I have made a referral to [id number] for health coverage assistance.

## 2011-12-02 NOTE — Progress Notes (Signed)
*  PRELIMINARY RESULTS* Vascular Ultrasound Left upper extremity venous duplex has been completed.  Preliminary findings: Left= No evidence of Deep vein or superficial thrombus.  Farrel Demark RDMS 12/02/2011, 11:15 AM

## 2011-12-02 NOTE — Consult Note (Addendum)
[facility] [place]  Patient name: Leonard Rowe MRN: [id number] DOB: [birth year] Sex: male  REASON FOR CONSULT: pain in the left arm  HPI: Donavin Audino Whitter is a 56 y.o. male who was working in his back yard with a chisel and a small piece of metal entered the volar aspect of his forearm. He did not think much of this. He had some mild discomfort associated with this. Yesterday however, he noted increasing pain in the upper part of his left arm along the medial aspect. It was very tender to touch at the antecubital level. He has had no fever or chills. He is unaware of any previous history of DVT or phlebitis.  History reviewed. No pertinent past medical history. PMH:He denies any history of diabetes, hypertension, hypercholesterolemia, history of previous myocardial infarction, history of congestive heart failure, or history of COPD.  FH: his father had a history of coronary artery disease a young age (in his 19s). In addition he has multiple brothers with coronary artery disease.  SOCIAL HISTORY: he does not smoke cigarettes and has never smoked cigarettes. History  Substance Use Topics  . Smoking status: Not on file  . Smokeless tobacco: Not on file  . Alcohol Use: Yes    Allergies  Allergen Reactions  . Codeine Hives and Itching    Current Facility-Administered Medications  Medication Dose Route Frequency Provider Last Rate Last Dose  . 0.9 %  sodium chloride infusion  1,000 mL Intravenous Continuous Candis Musa, PA 125 mL/hr at 12/02/11 0935 1,000 mL at 12/02/11 0935  . clindamycin (CLEOCIN) IVPB 600 mg  600 mg Intravenous Once Candis Musa, PA   600 mg at 12/02/11 0045  . clindamycin (CLEOCIN) IVPB 600 mg  600 mg Intravenous Q8H Jola Baptist Idol, PA   600 mg at 12/02/11 4098  . morphine 4 MG/ML injection 4 mg  4 mg Intravenous Q3H PRN Candis Musa, PA   4 mg at 12/02/11 1191  . ondansetron (ZOFRAN) injection 4 mg  4 mg Intravenous Q8H PRN Candis Musa, PA   4  mg at 12/02/11 4782  . promethazine (PHENERGAN) injection 25 mg  25 mg Intravenous To Major Rise Patience, PA   25 mg at 12/02/11 9562  . TDaP (BOOSTRIX) injection 0.5 mL  0.5 mL Intramuscular Once Jola Baptist Idol, PA   0.5 mL at 12/02/11 0058   No current outpatient prescriptions on file.    REVIEW OF SYSTEMS: Arly.Keller ] denotes positive finding; [  ] denotes negative finding CARDIOVASCULAR:  [ ]  chest pain   [ ]  chest pressure   [ ]  palpitations   [ ]  orthopnea   [ ]  dyspnea on exertion   [ ]  claudication   [ ]  rest pain   [ ]  DVT   [ ]  phlebitis PULMONARY:   [ ]  productive cough   [ ]  asthma   [ ]  wheezing NEUROLOGIC:   [ ]  weakness  [ ]  paresthesias  [ ]  aphasia  [ ]  amaurosis  [ ]  dizziness HEMATOLOGIC:   [ ]  bleeding problems   [ ]  clotting disorders MUSCULOSKELETAL:  [ ]  joint pain   [ ]  joint swelling [ ]  leg swelling GASTROINTESTINAL: [ ]   blood in stool  [ ]   hematemesis GENITOURINARY:  [ ]   dysuria  [ ]   hematuria PSYCHIATRIC:  [ ]  history of major depression INTEGUMENTARY:  [ ]  rashes  [ ]  ulcers CONSTITUTIONAL:  [ ]   fever   [ ]  chills  PHYSICAL EXAM: Filed Vitals:   12/02/11 0245 12/02/11 0700 12/02/11 0800 12/02/11 0900  BP: 156/93 143/100 161/87 140/86  Pulse:  60 61 67  Temp:      TempSrc:      Resp: 20 20 20    SpO2: 100% 100% 100% 100%   There is no height or weight on file to calculate BMI. GENERAL: The patient is a well-nourished male, in no acute distress. The vital signs are documented above. CARDIOVASCULAR: There is a regular rate and rhythm without significant murmur appreciated. I do not detect carotid bruits. PULMONARY: There is good air exchange bilaterally without wheezing or rales. ABDOMEN: Soft and non-tender with normal pitched bowel sounds.  MUSCULOSKELETAL: There are no major deformities or cyanosis. He has mild swelling and tenderness in the medial aspect of his left upper arm and also at the antecubital level. NEUROLOGIC: No focal weakness or paresthesias  are detected. SKIN: There are no ulcers or rashes noted. PSYCHIATRIC: The patient has a normal affect.  DATA:  Lab Results  Component Value Date   WBC 9.0 12/02/2011   HGB 13.3 12/02/2011   HCT 38.8* 12/02/2011   MCV 91.9 12/02/2011   PLT 347 12/02/2011   Lab Results  Component Value Date   NA 142 12/02/2011   K 3.5 12/02/2011   CL 106 12/02/2011   CO2 27 12/02/2011   Lab Results  Component Value Date   CREATININE 0.81 12/02/2011   I have reviewed his x-rays of his forearm which show a very small foreign body in the volar aspect of his mid forearm.  MEDICAL ISSUES: Based on his exam he appears to have thrombophlebitis involving the left upper arm basilic vein. I've ordered a venous duplex scan of the left upper extremity and I will follow up on these results. Assuming that this is an early thrombophlebitis I think this could be treated as an outpatient with warm compresses, antibiotics by mouth, and ibuprofen for pain. I could follow this as an outpatient. If his symptoms worsen or he develops a fever or erythema that he would need to be admitted for intravenous antibiotics.  Samantha Olivera S [facility] [place] Beeper: [phone]  Addendum: Venous duplex scan was reviewed and does not show any evidence of superficial thrombophlebitis or deep venous thrombosis. I discussed this with the emergency department physician assistant and the patient is to be treated with po antibiotics.  Di Kindle. Edilia Bo, MD, FACS Beeper [phone] 12/02/2011

## 2011-12-02 NOTE — ED Notes (Signed)
Vascular lab called. Spoke to [employer]. He will let staff know about test ordered

## 2011-12-02 NOTE — ED Notes (Signed)
PA at bedside.

## 2011-12-02 NOTE — Discharge Instructions (Signed)
Please return to the Emergency Department in 2 days for a recheck of your arm.  If you have increased redness, pain, or develop fevers greater than 100.4, please return immediately.  You may return to the ER at any time for worsening condition or any new symptoms that concern you.   Cellulitis Cellulitis is an infection of the skin and the tissue beneath it. The area is typically red and tender. It is caused by germs (bacteria) (usually staph or strep) that enter the body through cuts or sores. Cellulitis most commonly occurs in the arms or lower legs.  HOME CARE INSTRUCTIONS   If you are given a prescription for medications which kill germs (antibiotics), take as directed until finished.   If the infection is on the arm or leg, keep the limb elevated as able.   Use a warm cloth several times per day to relieve pain and encourage healing.   See your caregiver for recheck of the infected site as directed if problems arise.   Only take over-the-counter or prescription medicines for pain, discomfort, or fever as directed by your caregiver.  SEEK MEDICAL CARE IF:   The area of redness (inflammation) is spreading, there are red streaks coming from the infected site, or if a part of the infection begins to turn dark in color.   The joint or bone underneath the infected skin becomes painful after the skin has healed.   The infection returns in the same or another area after it seems to have gone away.   A boil or bump swells up. This may be an abscess.   New, unexplained problems such as pain or fever develop.  SEEK IMMEDIATE MEDICAL CARE IF:   You have a fever.   You or your child feels drowsy or lethargic.   There is vomiting, diarrhea, or lasting discomfort or feeling ill (malaise) with muscle aches and pains.  MAKE SURE YOU:   Understand these instructions.   Will watch your condition.   Will get help right away if you are not doing well or get worse.  Document Released:  07/06/2005 Document Revised: 06/08/2011 Document Reviewed: 05/14/2008 [facility] Patient Information 2012 [place], [place].

## 2011-12-02 NOTE — ED Provider Notes (Signed)
2:47 AM  I performed a history and physical examination of Leonard Rowe and discussed his management with Burgess Amor.  I agree with the history, physical, assessment, and plan of care, with the following exceptions: None  The patient was seen and evaluated by me along with Leonard Rowe. The patient is in no apparent distress, awake, alert, oriented appropriately in no respiratory distress, with warm dry skin, but notable for erythema at the volar left mid forearm where an underlying palpable foreign body can be appreciated, and erythematous streaking proximally up the patient's left arm with lymphadenopathy of the axilla. Apparent cellulitis. The patient is afebrile and nontoxic in appearance. Leonard Rowe made an incision to attempt to retrieve the foreign body without success. I subsequently lengthened the incision and attempted to inspect for the foreign body but was unable to find it. In order to definitively treat this infection, the foreign body will need to be removed. I contacted by telephone Dr. Shella Spearing of plastic surgery who agrees to come to the CDU in the morning to evaluate the patient in attempt to locate the foreign body for removal.  The patient has been placed on the cellulitis protocol overnight.  Evaluation and management procedures were performed by the PA/NP under my supervision/collaboration.   I was present for the following procedures: None Time Spent in Critical Care of the patient: None Time spent in discussions with the patient and family: 10 minutes  Leonard Rowe    Felisa Bonier, MD 12/02/11 [phone]

## 2011-12-02 NOTE — ED Notes (Signed)
SURGEON AT BEDSIDE. PT CONTINUES NAUSEATED. NO ACTIVE EMESIS. PHENERGAN HAS BEEN REQUESTED FROM PHARMACY

## 2011-12-04 ENCOUNTER — Other Ambulatory Visit: Payer: Self-pay

## 2011-12-04 ENCOUNTER — Emergency Department (HOSPITAL_COMMUNITY): Payer: Self-pay

## 2011-12-04 ENCOUNTER — Emergency Department (HOSPITAL_COMMUNITY)
Admission: EM | Admit: 2011-12-04 | Discharge: 2011-12-04 | Disposition: A | Discharge status: Home / Self Care | Payer: Self-pay | Attending: Emergency Medicine | Admitting: Emergency Medicine

## 2011-12-04 DIAGNOSIS — M549 Dorsalgia, unspecified: Secondary | ICD-10-CM | POA: Insufficient documentation

## 2011-12-04 DIAGNOSIS — M62838 Other muscle spasm: Secondary | ICD-10-CM | POA: Insufficient documentation

## 2011-12-04 DIAGNOSIS — M542 Cervicalgia: Secondary | ICD-10-CM | POA: Insufficient documentation

## 2011-12-04 MED ORDER — KETOROLAC TROMETHAMINE 60 MG/2ML IM SOLN
60.0000 mg | Freq: Once | INTRAMUSCULAR | Status: AC
  Administered 2011-12-04: 60 mg via INTRAMUSCULAR
  Filled 2011-12-04: qty 2

## 2011-12-04 MED ORDER — CYCLOBENZAPRINE HCL 10 MG PO TABS: 10.0000 mg | ORAL_TABLET | Freq: Two times a day (BID) | ORAL | Status: AC | PRN

## 2011-12-04 MED ORDER — METHOCARBAMOL 500 MG PO TABS
500.0000 mg | ORAL_TABLET | ORAL | Status: AC
  Administered 2011-12-04: 500 mg via ORAL
  Filled 2011-12-04: qty 1

## 2011-12-04 NOTE — ED Notes (Signed)
PT was treated for injury to RT arm.

## 2011-12-04 NOTE — ED Provider Notes (Signed)
Medical screening examination/treatment/procedure(s) were performed by non-physician practitioner and as supervising physician I was immediately available for consultation/collaboration.    Sevin Farone L Amma Crear, MD 12/04/11 2248 

## 2011-12-04 NOTE — ED Provider Notes (Signed)
History     CSN: [id number]  Arrival date & time 12/04/11  1107   First MD Initiated Contact with Patient 12/04/11 1142    HPI Patient was seen yesterday for a foreign body located in his left forearm. Was treated for cellulitis. Had shrieking up on him. Reports since then streaking has improved but now has pain that radiates up to his mid neck. Reports severe neck pain. Reports pain with movement and palpation. Does report that he has been laying in bed has not been moving his neck. Denies any other injury reports he'll take Percocet and ibuprofen which somewhat relieved the pain. Denies numbness, tingling, weakness, headache, fever  The history is provided by the patient.    No past medical history on file.  Past Surgical History  Procedure Date  . Rotator cuff repair   . Cholecystectomy   . Knee surgery     No family history on file.  History  Substance Use Topics  . Smoking status: Not on file  . Smokeless tobacco: Not on file  . Alcohol Use: Yes      Review of Systems  Constitutional: Negative for fever and chills.  HENT: Positive for neck pain.   Respiratory: Negative for cough and shortness of breath.   Cardiovascular: Negative for chest pain and palpitations.  Gastrointestinal: Negative for nausea, vomiting and abdominal pain.  Genitourinary: Negative for dysuria, hematuria and flank pain.  Musculoskeletal: Negative for myalgias, back pain and gait problem.       Denies saddle anesthesias, perineal numbness, bowel incontinence, urinary incontinence  Skin: Positive for wound. Negative for color change.  Neurological: Positive for numbness. Negative for dizziness, weakness and headaches.  All other systems reviewed and are negative.    Allergies  Codeine  Home Medications   Current Outpatient Rx  Name Route Sig Dispense Refill  . CEPHALEXIN 500 MG PO CAPS Oral Take 500 mg by mouth 4 (four) times daily. For 10 days.    . IBUPROFEN 800 MG PO TABS Oral Take  800 mg by mouth every 8 (eight) hours as needed. For pain.    Marland Kitchen OMEGA 3 PO Oral Take 3 capsules by mouth daily.    . OXYCODONE-ACETAMINOPHEN 5-325 MG PO TABS Oral Take 1 tablet by mouth every 6 (six) hours as needed. For pain.    Marland Kitchen PROMETHAZINE HCL 25 MG PO TABS Oral Take 25 mg by mouth every 8 (eight) hours as needed. For nausea.      BP 149/104  Pulse 78  Temp(Src) 97.9 F (36.6 C) (Oral)  Resp 22  SpO2 99%  Physical Exam  Constitutional: He is oriented to person, place, and time. He appears well-developed and well-nourished.  HENT:  Head: Normocephalic and atraumatic.  Eyes: Conjunctivae are normal. Pupils are equal, round, and reactive to light.  Neck: Normal range of motion. Neck supple. Spinous process tenderness and muscular tenderness present. No rigidity. No edema, no erythema and normal range of motion present. No Brudzinski's sign and no Kernig's sign noted.  Cardiovascular: Normal rate, regular rhythm and normal heart sounds.   Pulmonary/Chest: Effort normal and breath sounds normal.  Abdominal: Soft. Bowel sounds are normal.  Musculoskeletal:       Cervical back: He exhibits tenderness, bony tenderness, pain and spasm. He exhibits normal range of motion, no swelling, no edema and normal pulse.       Back:  Neurological: He is alert and oriented to person, place, and time. No cranial nerve deficit or sensory  deficit.  Skin: Skin is warm and dry. No rash noted. No erythema. No pallor.       Left forearm wound has significantly improved. Patient no longer has erythematous streaking up left upper arm. No purulent drainage or mass palpated.  Psychiatric: He has a normal mood and affect. His behavior is normal.    ED Course  Procedures    CERVICAL SPINE - COMPLETE 4+ VIEW 12/04/2011:  Comparison: Cervical spine x-rays 03/16/2007 [facility].  Findings: Anatomic alignment. No fractures. Well-preserved disc spaces throughout. Calcification in the anterior annular  fibers at C5-6. Prevertebral soft tissues normal. Facet joints intact. No significant bony foraminal stenoses. No static evidence of instability. No interval change.  IMPRESSION: No acute or significant abnormalities. Stable examination.      ED ECG REPORT   Date: 12/04/2011  EKG Time: 1:04 PM  Rate: 74  Rhythm: normal sinus rhythm and premature atrial contractions (PAC),  unchanged from previous tracings  Axis: rightward axis  Intervals:incomplete RBBB  ST&T Change: none   Narrative Interpretation: no significant changes    MDM    No abnormalities on cervical neck x-ray. Suspect pain is 2 to muscle stiffness from not doing gentle range of motion exercises in bed, and from Cradling L arm. Will prescribe additional muscle relaxants. Thomasene Lot, PA-C 12/04/11 1305

## 2011-12-04 NOTE — ED Notes (Addendum)
Here with c/o pain L shoulder,arm and back of head since yesterday am.pt was seen here Thursday with metal in his L arm and dcd fri. Pt was told to come back today for re- eval and possible antibiotics.no redness,swelling or drainage noted from L arm

## 2011-12-06 NOTE — ED Provider Notes (Signed)
Evaluation and management procedures were performed by the PA/NP under my supervision/collaboration.   Felisa Bonier, MD 12/06/11 2023

## 2011-12-06 NOTE — ED Provider Notes (Signed)
Evaluation and management procedures were performed by the PA/NP under my supervision/collaboration.   Felisa Bonier, MD 12/06/11 2022

## 2013-05-08 ENCOUNTER — Encounter (HOSPITAL_COMMUNITY): Payer: Self-pay | Admitting: Emergency Medicine

## 2013-05-08 ENCOUNTER — Ambulatory Visit (HOSPITAL_COMMUNITY)
Admit: 2013-05-08 | Discharge: 2013-05-08 | Disposition: A | Discharge status: Home / Self Care | Payer: Self-pay | Source: Ambulatory Visit | Attending: Emergency Medicine | Admitting: Emergency Medicine

## 2013-05-08 ENCOUNTER — Emergency Department (INDEPENDENT_AMBULATORY_CARE_PROVIDER_SITE_OTHER)
Admission: EM | Admit: 2013-05-08 | Discharge: 2013-05-08 | Disposition: A | Discharge status: Home / Self Care | Payer: Self-pay | Source: Home / Self Care | Attending: Emergency Medicine | Admitting: Emergency Medicine

## 2013-05-08 DIAGNOSIS — S8990XA Unspecified injury of unspecified lower leg, initial encounter: Secondary | ICD-10-CM | POA: Insufficient documentation

## 2013-05-08 DIAGNOSIS — S99929A Unspecified injury of unspecified foot, initial encounter: Secondary | ICD-10-CM | POA: Insufficient documentation

## 2013-05-08 DIAGNOSIS — X58XXXA Exposure to other specified factors, initial encounter: Secondary | ICD-10-CM | POA: Insufficient documentation

## 2013-05-08 DIAGNOSIS — M722 Plantar fascial fibromatosis: Secondary | ICD-10-CM

## 2013-05-08 MED ORDER — TRAMADOL HCL 50 MG PO TABS: 50.0000 mg | ORAL_TABLET | Freq: Four times a day (QID) | ORAL | Status: DC | PRN

## 2013-05-08 MED ORDER — MELOXICAM 7.5 MG PO TABS: 7.5000 mg | ORAL_TABLET | Freq: Every day | ORAL | Status: DC

## 2013-05-08 NOTE — ED Provider Notes (Addendum)
CSN: [id number]     Arrival date & time 05/08/13  1101 History     First MD Initiated Contact with Patient 05/08/13 1130     Chief Complaint  Patient presents with  . Foot Injury   (Consider location/radiation/quality/duration/timing/severity/associated sxs/prior Treatment) HPI Comments: Patient presents to urgent care his afternoon complaining that yesterday he developed a sudden sharp pain on the top of his left foot that started after he was pushing a heavy equipment at his job yesterday. He also describes that about 3-4 months ago something heavy fell on top of his foot at that time. He did get to express some pain and discomfort and swelling afterwards. He has been taking Aleve since yesterday with no relief. Feels that this morning when he walks on his foot. The pain is also present on the plantar surface of his left foot. Occasionally he feels a tingling sensation. Both on the bottom of his foot as well as in the dorsal aspect of his foot.  Patient is a 57 y.o. male presenting with foot injury. The history is provided by the patient.  Foot Injury Location:  Foot Time since incident:  1 day Foot location:  L foot Pain details:    Quality:  Aching   Radiates to:  Does not radiate   Severity:  Moderate   Onset quality:  Sudden   Timing:  Constant Chronicity:  Recurrent Prior injury to area:  Yes Relieved by:  Nothing Worsened by:  Bearing weight, extension and flexion Associated symptoms: decreased ROM, stiffness and tingling   Associated symptoms: no fatigue, no fever, no muscle weakness and no neck pain     History reviewed. No pertinent past medical history. Past Surgical History  Procedure Laterality Date  . Rotator cuff repair    . Cholecystectomy    . Knee surgery     History reviewed. No pertinent family history. History  Substance Use Topics  . Smoking status: Not on file  . Smokeless tobacco: Not on file  . Alcohol Use: Yes    Review of Systems    Constitutional: Negative for fever, chills, diaphoresis, appetite change, fatigue and unexpected weight change.  HENT: Negative for neck pain.   Musculoskeletal: Positive for stiffness.       Left foot pain  Neurological: Negative for weakness.    Allergies  Codeine  Home Medications   Current Outpatient Rx  Name  Route  Sig  Dispense  Refill  . meloxicam (MOBIC) 7.5 MG tablet   Oral   Take 1 tablet (7.5 mg total) by mouth daily. Take one tablet daily for 2 weeks   14 tablet   0   . Omega-3 Fatty Acids (OMEGA 3 PO)   Oral   Take 3 capsules by mouth daily.         . traMADol (ULTRAM) 50 MG tablet   Oral   Take 1 tablet (50 mg total) by mouth every 6 (six) hours as needed for pain.   15 tablet   0    BP 153/90  Pulse 59  Temp(Src) 98 F (36.7 C) (Oral)  Resp 18  SpO2 99% Physical Exam  Nursing note and vitals reviewed. Constitutional: He appears well-developed and well-nourished.  Musculoskeletal: He exhibits tenderness.       Left ankle: He exhibits normal range of motion, no swelling, no ecchymosis, no deformity, no laceration and normal pulse. No lateral malleolus, no medial malleolus, no CF ligament, no posterior TFL, no head of 5th metatarsal  and no proximal fibula tenderness found. Achilles tendon normal.       Feet:  Neurological: He is alert.  Skin: No rash noted. No erythema. No pallor.    ED Course   Procedures (including critical care time)  Labs Reviewed - No data to display Dg Foot Complete Left  05/08/2013   *RADIOLOGY REPORT*  Clinical Data: Foot injury  LEFT FOOT - COMPLETE 3+ VIEW  Comparison: None.  Findings: Three views of the left foot submitted.  No acute fracture or subluxation.  No periosteal reaction or bony erosion.  IMPRESSION: No acute fracture or subluxation.   Original Report Authenticated By: Natasha Mead, M.D.   1. Plantar fasciitis of left foot     MDM  Diagnostic impression Tendosynovitis and plantar fasciitis of left  foot.  Plan of care: Patient has been instructed to keep weight off his foot for the next 48-72 hours. Have been provided postop shoe with crutches. Was prescribed a course of meloxicam for 10-14 days Was prescribed Ultram for pain management ( have discussed the patient possibility of cross sensitivity with codeine allergy) Patient was instructed to follow-up wi ath an orthopedic Dr. 5-7 days if pain persists on weight-bearing activities. This with the patient to followup as if pain persists we'll send 10 days or worsens further more advanced imaging studies such as MRI might be necessary. Patient agreed with treatment plan and followup care as necessary.    Discharge Medication List as of 05/08/2013  1:05 PM       Jimmie Molly, MD 05/08/13 1333  Jimmie Molly, MD 05/08/13 1335

## 2013-05-08 NOTE — ED Notes (Signed)
Patient has to to radiology to get x ray since our machine is down

## 2013-05-08 NOTE — ED Notes (Signed)
States as he was working yesterday he got a sharp pain on top of left foot as he was walking.   Patient states now his foot is numb and is throbbing.  Denies any injury.  Aleve taken but no relief.

## 2013-09-21 ENCOUNTER — Emergency Department (HOSPITAL_COMMUNITY)
Admission: EM | Admit: 2013-09-21 | Discharge: 2013-09-21 | Disposition: A | Discharge status: Home / Self Care | Payer: No Typology Code available for payment source | Attending: Emergency Medicine | Admitting: Emergency Medicine

## 2013-09-21 ENCOUNTER — Emergency Department (HOSPITAL_COMMUNITY): Payer: No Typology Code available for payment source

## 2013-09-21 ENCOUNTER — Encounter (HOSPITAL_COMMUNITY): Payer: Self-pay | Admitting: Emergency Medicine

## 2013-09-21 DIAGNOSIS — S0990XA Unspecified injury of head, initial encounter: Secondary | ICD-10-CM | POA: Insufficient documentation

## 2013-09-21 DIAGNOSIS — S46909A Unspecified injury of unspecified muscle, fascia and tendon at shoulder and upper arm level, unspecified arm, initial encounter: Secondary | ICD-10-CM | POA: Insufficient documentation

## 2013-09-21 DIAGNOSIS — R110 Nausea: Secondary | ICD-10-CM | POA: Insufficient documentation

## 2013-09-21 DIAGNOSIS — S79919A Unspecified injury of unspecified hip, initial encounter: Secondary | ICD-10-CM | POA: Insufficient documentation

## 2013-09-21 DIAGNOSIS — S6990XA Unspecified injury of unspecified wrist, hand and finger(s), initial encounter: Secondary | ICD-10-CM | POA: Insufficient documentation

## 2013-09-21 DIAGNOSIS — R42 Dizziness and giddiness: Secondary | ICD-10-CM | POA: Insufficient documentation

## 2013-09-21 DIAGNOSIS — Y92410 Unspecified street and highway as the place of occurrence of the external cause: Secondary | ICD-10-CM | POA: Insufficient documentation

## 2013-09-21 DIAGNOSIS — Y9389 Activity, other specified: Secondary | ICD-10-CM | POA: Insufficient documentation

## 2013-09-21 DIAGNOSIS — S59909A Unspecified injury of unspecified elbow, initial encounter: Secondary | ICD-10-CM | POA: Insufficient documentation

## 2013-09-21 DIAGNOSIS — S4980XA Other specified injuries of shoulder and upper arm, unspecified arm, initial encounter: Secondary | ICD-10-CM | POA: Insufficient documentation

## 2013-09-21 DIAGNOSIS — S0993XA Unspecified injury of face, initial encounter: Secondary | ICD-10-CM | POA: Insufficient documentation

## 2013-09-21 DIAGNOSIS — Z9889 Other specified postprocedural states: Secondary | ICD-10-CM | POA: Insufficient documentation

## 2013-09-21 DIAGNOSIS — Z79899 Other long term (current) drug therapy: Secondary | ICD-10-CM | POA: Insufficient documentation

## 2013-09-21 MED ORDER — ONDANSETRON HCL 4 MG/2ML IJ SOLN
4.0000 mg | Freq: Once | INTRAMUSCULAR | Status: AC
  Administered 2013-09-21: 4 mg via INTRAVENOUS
  Filled 2013-09-21: qty 2

## 2013-09-21 MED ORDER — MORPHINE SULFATE 4 MG/ML IJ SOLN
4.0000 mg | Freq: Once | INTRAMUSCULAR | Status: AC
  Administered 2013-09-21: 4 mg via INTRAVENOUS
  Filled 2013-09-21: qty 1

## 2013-09-21 MED ORDER — IBUPROFEN 800 MG PO TABS: 800.0000 mg | ORAL_TABLET | Freq: Three times a day (TID) | ORAL | Status: DC

## 2013-09-21 MED ORDER — METHOCARBAMOL 500 MG PO TABS: 500.0000 mg | ORAL_TABLET | Freq: Two times a day (BID) | ORAL | Status: DC

## 2013-09-21 MED ORDER — IOHEXOL 300 MG/ML  SOLN
100.0000 mL | Freq: Once | INTRAMUSCULAR | Status: AC | PRN
  Administered 2013-09-21: 100 mL via INTRAVENOUS

## 2013-09-21 MED ORDER — TRAMADOL HCL 50 MG PO TABS: 50.0000 mg | ORAL_TABLET | Freq: Four times a day (QID) | ORAL | Status: DC | PRN
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
## 2013-09-21 NOTE — ED Notes (Signed)
MD at bedside; pt requesting pain medicine.
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
## 2013-09-21 NOTE — ED Notes (Signed)
Patient stated feels nausea PA notified.

## 2013-09-21 NOTE — ED Provider Notes (Signed)
  Medical screening examination/treatment/procedure(s) were performed by non-physician practitioner and as supervising physician I was immediately available for consultation/collaboration.  EKG Interpretation   None          Gerhard Munch, MD 09/21/13 1654

## 2013-09-21 NOTE — ED Notes (Signed)
Urinal and warm blanket given.

## 2013-09-21 NOTE — ED Notes (Signed)
Patient transported to X-ray and CT 

## 2014-07-17 ENCOUNTER — Emergency Department (HOSPITAL_COMMUNITY)
Admission: EM | Admit: 2014-07-17 | Discharge: 2014-07-17 | Disposition: A | Discharge status: Home / Self Care | Payer: No Typology Code available for payment source | Attending: Emergency Medicine | Admitting: Emergency Medicine

## 2014-07-17 ENCOUNTER — Encounter (HOSPITAL_COMMUNITY): Payer: Self-pay | Admitting: Emergency Medicine

## 2014-07-17 DIAGNOSIS — T148XXA Other injury of unspecified body region, initial encounter: Secondary | ICD-10-CM

## 2014-07-17 DIAGNOSIS — Z79899 Other long term (current) drug therapy: Secondary | ICD-10-CM | POA: Insufficient documentation

## 2014-07-17 DIAGNOSIS — S39011A Strain of muscle, fascia and tendon of abdomen, initial encounter: Secondary | ICD-10-CM | POA: Insufficient documentation

## 2014-07-17 DIAGNOSIS — Z9049 Acquired absence of other specified parts of digestive tract: Secondary | ICD-10-CM | POA: Insufficient documentation

## 2014-07-17 DIAGNOSIS — Z791 Long term (current) use of non-steroidal anti-inflammatories (NSAID): Secondary | ICD-10-CM | POA: Insufficient documentation

## 2014-07-17 DIAGNOSIS — S3992XA Unspecified injury of lower back, initial encounter: Secondary | ICD-10-CM | POA: Insufficient documentation

## 2014-07-17 DIAGNOSIS — S199XXA Unspecified injury of neck, initial encounter: Secondary | ICD-10-CM | POA: Insufficient documentation

## 2014-07-17 DIAGNOSIS — K4090 Unilateral inguinal hernia, without obstruction or gangrene, not specified as recurrent: Secondary | ICD-10-CM | POA: Insufficient documentation

## 2014-07-17 DIAGNOSIS — Y9289 Other specified places as the place of occurrence of the external cause: Secondary | ICD-10-CM | POA: Insufficient documentation

## 2014-07-17 DIAGNOSIS — Y990 Civilian activity done for income or pay: Secondary | ICD-10-CM | POA: Insufficient documentation

## 2014-07-17 DIAGNOSIS — X58XXXA Exposure to other specified factors, initial encounter: Secondary | ICD-10-CM | POA: Insufficient documentation

## 2014-07-17 DIAGNOSIS — Y9389 Activity, other specified: Secondary | ICD-10-CM | POA: Insufficient documentation

## 2014-07-17 MED ORDER — ONDANSETRON 4 MG PO TBDP
4.0000 mg | ORAL_TABLET | Freq: Once | ORAL | Status: AC
  Administered 2014-07-17: 4 mg via ORAL
  Filled 2014-07-17: qty 1

## 2014-07-17 MED ORDER — OXYCODONE-ACETAMINOPHEN 5-325 MG PO TABS: 1.0000 | ORAL_TABLET | Freq: Four times a day (QID) | ORAL | Status: DC | PRN

## 2014-07-17 MED ORDER — METAXALONE 800 MG PO TABS: 800.0000 mg | ORAL_TABLET | Freq: Three times a day (TID) | ORAL | Status: DC | PRN

## 2014-07-17 MED ORDER — PROMETHAZINE HCL 25 MG PO TABS: 25.0000 mg | ORAL_TABLET | Freq: Four times a day (QID) | ORAL | Status: DC | PRN

## 2014-07-17 NOTE — ED Provider Notes (Signed)
CSN: [id number]     Arrival date & time 07/17/14  1036 History   First MD Initiated Contact with Patient 07/17/14 1056     Chief Complaint  Patient presents with  . Groin Pain     (Consider location/radiation/quality/duration/timing/severity/associated sxs/prior Treatment) Patient is a 58 y.o. male presenting with groin pain. The history is provided by the patient.  Groin Pain This is a new problem. Associated symptoms include abdominal pain. Pertinent negatives include no chest pain, no headaches and no shortness of breath.   patient was building a retaining wall yesterday he did nothing 95 pound bags of motor and also some stones. He states that lifting the last day he had pain in his right shoulder abdomen and lower leg. He states it felt as if there was a pop in his belly it then went away. He states it is somewhat weak on the right side of the same time. He's had some nausea since. No vomiting. No diarrhea. No constipation. He states there is no more numbness or weakness. He states he has pain in his right midabdomen. No difficulty with urinating. No headache. No confusion.  History reviewed. No pertinent past medical history. Past Surgical History  Procedure Laterality Date  . Rotator cuff repair    . Cholecystectomy    . Knee surgery     No family history on file. History  Substance Use Topics  . Smoking status: Former Games developer  . Smokeless tobacco: Not on file  . Alcohol Use: No    Review of Systems  Constitutional: Negative for activity change and appetite change.  Eyes: Negative for pain.  Respiratory: Negative for chest tightness and shortness of breath.   Cardiovascular: Negative for chest pain and leg swelling.  Gastrointestinal: Positive for nausea and abdominal pain. Negative for vomiting and diarrhea.  Genitourinary: Negative for flank pain.  Musculoskeletal: Positive for back pain and neck pain. Negative for neck stiffness.  Skin: Negative for rash.   Neurological: Positive for weakness. Negative for numbness and headaches.  Psychiatric/Behavioral: Negative for behavioral problems.      Allergies  Codeine  Home Medications   Prior to Admission medications   Medication Sig Start Date End Date Taking? Authorizing Provider  naproxen sodium (ANAPROX) 220 MG tablet Take 220 mg by mouth 2 (two) times daily with a meal.   Yes Historical Provider, MD  Omega-3 Fatty Acids (OMEGA 3 PO) Take 3 capsules by mouth daily.   Yes Historical Provider, MD  metaxalone (SKELAXIN) 800 MG tablet Take 1 tablet (800 mg total) by mouth 3 (three) times daily as needed for muscle spasms. 07/17/14   Juliet Rude. Eppie Barhorst, MD  oxyCODONE-acetaminophen (PERCOCET/ROXICET) 5-325 MG per tablet Take 1-2 tablets by mouth every 6 (six) hours as needed for severe pain. 07/17/14   Juliet Rude. Carless Slatten, MD  promethazine (PHENERGAN) 25 MG tablet Take 25 mg by mouth every 8 (eight) hours as needed. For nausea. 12/02/11 12/09/11  Trixie Dredge, PA-C  promethazine (PHENERGAN) 25 MG tablet Take 1 tablet (25 mg total) by mouth every 6 (six) hours as needed for nausea. 07/17/14   Juliet Rude. Raquel Sayres, MD   BP 135/83  Pulse 62  Temp(Src) 98.1 F (36.7 C) (Oral)  Resp 15  Ht 6' (1.829 m)  Wt 170 lb (77.111 kg)  BMI 23.05 kg/m2  SpO2 99% Physical Exam  Constitutional: He appears well-developed and well-nourished.  Eyes: EOM are normal. Pupils are equal, round, and reactive to light.  Neck: Neck supple.  Mild  tenderness to trapezius on right neck.  Cardiovascular: Normal rate and regular rhythm.   Pulmonary/Chest: Effort normal and breath sounds normal.  Abdominal: He exhibits mass. There is tenderness.  Right inguinal hernia easily reduced and did not reoccur  Musculoskeletal: Normal range of motion. He exhibits no tenderness.  Skin: Skin is warm.    ED Course  Procedures (including critical care time) Labs Review Labs Reviewed - No data to display  Imaging Review No results  found.   EKG Interpretation None      MDM   Final diagnoses:  Unilateral inguinal hernia without obstruction or gangrene, recurrence not specified  Muscle strain    Patient with groin and abdominal pain that began after lifting. May be a musculoskeletal component to 2 lifting heavy supplies, but also had an inguinal hernia that reduced. The pain had improved after the reduction of the hernia. No signs of severe instruction, however did have some nausea. Likely musculoskeletal and hernia component to his pain. There are no neuro deficits at this time. The weakness may be due to pain or some previous disc abnormalities he has had been exacerbated. No red flags. Will discharge home. Patient was given instructions in terms of followup will be given pain medicines and muscle relaxers for muscle pains.    Juliet RudeNathan R. Rubin PayorPickering, MD 07/17/14 1135

## 2014-07-17 NOTE — ED Notes (Signed)
Pt brought back to room via wheelchair; pt getting undressed and into a gown at this time; Marcelino DusterMichelle, RN present in room

## 2014-07-17 NOTE — Discharge Instructions (Signed)
Hernia A hernia occurs when an internal organ pushes out through a weak spot in the abdominal wall. Hernias most commonly occur in the groin and around the navel. Hernias often can be pushed back into place (reduced). Most hernias tend to get worse over time. Some abdominal hernias can get stuck in the opening (irreducible or incarcerated hernia) and cannot be reduced. An irreducible abdominal hernia which is tightly squeezed into the opening is at risk for impaired blood supply (strangulated hernia). A strangulated hernia is a medical emergency. Because of the risk for an irreducible or strangulated hernia, surgery may be recommended to repair a hernia. CAUSES   Heavy lifting.  Prolonged coughing.  Straining to have a bowel movement.  A cut (incision) made during an abdominal surgery. HOME CARE INSTRUCTIONS   Bed rest is not required. You may continue your normal activities.  Avoid lifting more than 10 pounds (4.5 kg) or straining.  Cough gently. If you are a smoker it is best to stop. Even the best hernia repair can break down with the continual strain of coughing. Even if you do not have your hernia repaired, a cough will continue to aggravate the problem.  Do not wear anything tight over your hernia. Do not try to keep it in with an outside bandage or truss. These can damage abdominal contents if they are trapped within the hernia sac.  Eat a normal diet.  Avoid constipation. Straining over long periods of time will increase hernia size and encourage breakdown of repairs. If you cannot do this with diet alone, stool softeners may be used. SEEK IMMEDIATE MEDICAL CARE IF:   You have a fever.  You develop increasing abdominal pain.  You feel nauseous or vomit.  Your hernia is stuck outside the abdomen, looks discolored, feels hard, or is tender.  You have any changes in your bowel habits or in the hernia that are unusual for you.  You have increased pain or swelling around the  hernia.  You cannot push the hernia back in place by applying gentle pressure while lying down. MAKE SURE YOU:   Understand these instructions.  Will watch your condition.  Will get help right away if you are not doing well or get worse. Document Released: 09/26/2005 Document Revised: 12/19/2011 Document Reviewed: 05/15/2008 [facility] Patient Information 2015 [employer], [place]. This information is not intended to replace advice given to you by your health care provider. Make sure you discuss any questions you have with your health care provider.  Muscle Strain A muscle strain is an injury that occurs when a muscle is stretched beyond its normal length. Usually a small number of muscle fibers are torn when this happens. Muscle strain is rated in degrees. First-degree strains have the least amount of muscle fiber tearing and pain. Second-degree and third-degree strains have increasingly more tearing and pain.  Usually, recovery from muscle strain takes 1-2 weeks. Complete healing takes 5-6 weeks.  CAUSES  Muscle strain happens when a sudden, violent force placed on a muscle stretches it too far. This may occur with lifting, sports, or a fall.  RISK FACTORS Muscle strain is especially common in athletes.  SIGNS AND SYMPTOMS At the site of the muscle strain, there may be:  Pain.  Bruising.  Swelling.  Difficulty using the muscle due to pain or lack of normal function. DIAGNOSIS  Your health care provider will perform a physical exam and ask about your medical history. TREATMENT  Often, the best treatment for a muscle  strain is resting, icing, and applying cold compresses to the injured area.  HOME CARE INSTRUCTIONS   Use the PRICE method of treatment to promote muscle healing during the first 2-3 days after your injury. The PRICE method involves:  Protecting the muscle from being injured again.  Restricting your activity and resting the injured body part.  Icing your injury. To do  this, put ice in a plastic bag. Place a towel between your skin and the bag. Then, apply the ice and leave it on from 15-20 minutes each hour. After the third day, switch to moist heat packs.  Apply compression to the injured area with a splint or elastic bandage. Be careful not to wrap it too tightly. This may interfere with blood circulation or increase swelling.  Elevate the injured body part above the level of your heart as often as you can.  Only take over-the-counter or prescription medicines for pain, discomfort, or fever as directed by your health care provider.  Warming up prior to exercise helps to prevent future muscle strains. SEEK MEDICAL CARE IF:   You have increasing pain or swelling in the injured area.  You have numbness, tingling, or a significant loss of strength in the injured area. MAKE SURE YOU:   Understand these instructions.  Will watch your condition.  Will get help right away if you are not doing well or get worse. Document Released: 09/26/2005 Document Revised: 07/17/2013 Document Reviewed: 04/25/2013 [facility] Patient Information 2015 [place], [place]. This information is not intended to replace advice given to you by your health care provider. Make sure you discuss any questions you have with your health care provider.

## 2014-07-17 NOTE — ED Notes (Signed)
Reports yesterday picked up a 95lb bag of mortar, felt pop in RLQ. Feels burning sensation. Denies swelling to testicles. No problems urinating. Also right leg pain and hip  Pain. Is nauseated. Pain has been since yesterday.

## 2014-07-21 ENCOUNTER — Emergency Department (HOSPITAL_COMMUNITY)
Admission: EM | Admit: 2014-07-21 | Discharge: 2014-07-21 | Disposition: A | Discharge status: Home / Self Care | Payer: Self-pay | Attending: Emergency Medicine | Admitting: Emergency Medicine

## 2014-07-21 ENCOUNTER — Emergency Department (HOSPITAL_COMMUNITY): Payer: Self-pay

## 2014-07-21 ENCOUNTER — Encounter (HOSPITAL_COMMUNITY): Payer: Self-pay | Admitting: Emergency Medicine

## 2014-07-21 DIAGNOSIS — Z87891 Personal history of nicotine dependence: Secondary | ICD-10-CM | POA: Insufficient documentation

## 2014-07-21 DIAGNOSIS — Z791 Long term (current) use of non-steroidal anti-inflammatories (NSAID): Secondary | ICD-10-CM | POA: Insufficient documentation

## 2014-07-21 DIAGNOSIS — K4020 Bilateral inguinal hernia, without obstruction or gangrene, not specified as recurrent: Secondary | ICD-10-CM | POA: Insufficient documentation

## 2014-07-21 DIAGNOSIS — Z79899 Other long term (current) drug therapy: Secondary | ICD-10-CM | POA: Insufficient documentation

## 2014-07-21 DIAGNOSIS — R109 Unspecified abdominal pain: Secondary | ICD-10-CM

## 2014-07-21 DIAGNOSIS — Z9049 Acquired absence of other specified parts of digestive tract: Secondary | ICD-10-CM | POA: Insufficient documentation

## 2014-07-21 LAB — COMPREHENSIVE METABOLIC PANEL
ALT: 18 U/L (ref 0–53)
AST: 21 U/L (ref 0–37)
Albumin: 4 g/dL (ref 3.5–5.2)
Alkaline Phosphatase: 60 U/L (ref 39–117)
Anion gap: 10 (ref 5–15)
BUN: 7 mg/dL (ref 6–23)
CALCIUM: 9.3 mg/dL (ref 8.4–10.5)
CO2: 29 mEq/L (ref 19–32)
Chloride: 105 mEq/L (ref 96–112)
Creatinine, Ser: 0.92 mg/dL (ref 0.50–1.35)
GFR calc Af Amer: >90 mL/min (ref >90)
GFR calc non Af Amer: >90 mL/min (ref >90)
Glucose, Bld: 94 mg/dL (ref 70–99)
Potassium: 5.1 mEq/L (ref 3.7–5.3)
SODIUM: 144 meq/L (ref 137–147)
TOTAL PROTEIN: 7.4 g/dL (ref 6.0–8.3)
Total Bilirubin: 0.7 mg/dL (ref 0.3–1.2)

## 2014-07-21 LAB — URINALYSIS, ROUTINE W REFLEX MICROSCOPIC
Bilirubin Urine: NEGATIVE
Glucose, UA: NEGATIVE mg/dL
Ketones, ur: NEGATIVE mg/dL
Leukocytes, UA: NEGATIVE
Nitrite: NEGATIVE
Protein, ur: NEGATIVE mg/dL
SPECIFIC GRAVITY, URINE: 1.015 (ref 1.005–1.030)
Urobilinogen, UA: 0.2 mg/dL (ref 0.0–1.0)
pH: 8 (ref 5.0–8.0)

## 2014-07-21 LAB — CBC WITH DIFFERENTIAL/PLATELET
BASOS PCT: 2 % — AB (ref 0–1)
Basophils Absolute: 0.1 10*3/uL (ref 0.0–0.1)
EOS ABS: 0.3 10*3/uL (ref 0.0–0.7)
Eosinophils Relative: 4 % (ref 0–5)
HCT: 42.3 % (ref 39.0–52.0)
Hemoglobin: 14.6 g/dL (ref 13.0–17.0)
LYMPHS ABS: 3.1 10*3/uL (ref 0.7–4.0)
Lymphocytes Relative: 49 % — ABNORMAL HIGH (ref 12–46)
MCH: 31.3 pg (ref 26.0–34.0)
MCHC: 34.5 g/dL (ref 30.0–36.0)
MCV: 90.8 fL (ref 78.0–100.0)
Monocytes Absolute: 0.5 10*3/uL (ref 0.1–1.0)
Monocytes Relative: 7 % (ref 3–12)
NEUTROS PCT: 38 % — AB (ref 43–77)
Neutro Abs: 2.5 10*3/uL (ref 1.7–7.7)
PLATELETS: 324 10*3/uL (ref 150–400)
RBC: 4.66 MIL/uL (ref 4.22–5.81)
RDW: 12.8 % (ref 11.5–15.5)
WBC: 6.4 10*3/uL (ref 4.0–10.5)

## 2014-07-21 LAB — URINE MICROSCOPIC-ADD ON

## 2014-07-21 MED ORDER — OXYCODONE-ACETAMINOPHEN 5-325 MG PO TABS
2.0000 | ORAL_TABLET | Freq: Once | ORAL | Status: AC
  Administered 2014-07-21: 1 via ORAL
  Filled 2014-07-21: qty 2

## 2014-07-21 MED ORDER — ONDANSETRON 4 MG PO TBDP
4.0000 mg | ORAL_TABLET | Freq: Once | ORAL | Status: AC
  Administered 2014-07-21: 4 mg via ORAL
  Filled 2014-07-21: qty 1

## 2014-07-21 MED ORDER — IOHEXOL 300 MG/ML  SOLN
25.0000 mL | INTRAMUSCULAR | Status: AC
  Administered 2014-07-21: 25 mL via ORAL

## 2014-07-21 MED ORDER — ONDANSETRON HCL 4 MG PO TABS: 4.0000 mg | ORAL_TABLET | Freq: Four times a day (QID) | ORAL | Status: DC

## 2014-07-21 MED ORDER — OXYCODONE-ACETAMINOPHEN 5-325 MG PO TABS: 1.0000 | ORAL_TABLET | ORAL | Status: DC | PRN

## 2014-07-21 NOTE — ED Provider Notes (Signed)
CSN: [id number]     Arrival date & time 07/21/14  1256 History   First MD Initiated Contact with Patient 07/21/14 1613     Chief Complaint  Patient presents with  . Abdominal Pain     (Consider location/radiation/quality/duration/timing/severity/associated sxs/prior Treatment) Patient is a 58 y.o. male presenting with abdominal pain. The history is provided by the patient and medical records.  Abdominal Pain  This is a 58 year old male with no significant past medical history presenting to the ED for abdominal pain. Patient was seen in the ED 4 days ago and diagnosed with a right inguinal hernia that was easily reduced.  States this occurred after lifting a 95 pound bag of mortar.  States since returning home, he has mostly been on bedrest. He denies any other heavy lifting. He states he continues to have ongoing pain in his right lower quadrant is unrelieved by his home a medication. He does note some nausea and vomiting, but thinks it is because he is taking pain medication on empty stomach. He denies any fever or chills. No urinary symptoms. Bowel movements have been normal.  Prior abdominal surgeries include cholecystectomy.   Vs stable on arrival.  History reviewed. No pertinent past medical history. Past Surgical History  Procedure Laterality Date  . Rotator cuff repair    . Cholecystectomy    . Knee surgery     History reviewed. No pertinent family history. History  Substance Use Topics  . Smoking status: Former Games developermoker  . Smokeless tobacco: Not on file  . Alcohol Use: No    Review of Systems  Gastrointestinal: Positive for abdominal pain.  All other systems reviewed and are negative.     Allergies  Codeine  Home Medications   Prior to Admission medications   Medication Sig Start Date End Date Taking? Authorizing Provider  metaxalone (SKELAXIN) 800 MG tablet Take 1 tablet (800 mg total) by mouth 3 (three) times daily as needed for muscle spasms. 07/17/14   Juliet RudeNathan R.  Pickering, MD  naproxen sodium (ANAPROX) 220 MG tablet Take 220 mg by mouth 2 (two) times daily with a meal.    Historical Provider, MD  Omega-3 Fatty Acids (OMEGA 3 PO) Take 3 capsules by mouth daily.    Historical Provider, MD  oxyCODONE-acetaminophen (PERCOCET/ROXICET) 5-325 MG per tablet Take 1-2 tablets by mouth every 6 (six) hours as needed for severe pain. 07/17/14   Juliet RudeNathan R. Pickering, MD  promethazine (PHENERGAN) 25 MG tablet Take 25 mg by mouth every 8 (eight) hours as needed. For nausea. 12/02/11 12/09/11  Trixie DredgeEmily West, PA-C  promethazine (PHENERGAN) 25 MG tablet Take 1 tablet (25 mg total) by mouth every 6 (six) hours as needed for nausea. 07/17/14   Juliet RudeNathan R. Pickering, MD   BP 143/79  Pulse 76  Temp(Src) 98.8 F (37.1 C) (Oral)  Resp 18  SpO2 100%  Physical Exam  Nursing note and vitals reviewed. Constitutional: He is oriented to person, place, and time. He appears well-developed and well-nourished. No distress.  HENT:  Head: Normocephalic and atraumatic.  Mouth/Throat: Oropharynx is clear and moist.  Eyes: Conjunctivae and EOM are normal. Pupils are equal, round, and reactive to light.  Neck: Normal range of motion. Neck supple.  Cardiovascular: Normal rate, regular rhythm and normal heart sounds.   Pulmonary/Chest: Effort normal and breath sounds normal. No respiratory distress. He has no wheezes.  Abdominal: Soft. Bowel sounds are normal. There is tenderness. There is no guarding.    Musculoskeletal: Normal range of  motion.  Abdomen soft, nondistended, mild tenderness in right lower quadrant, small defect noted in abdominal wall without protruding hernia  Neurological: He is alert and oriented to person, place, and time.  Skin: Skin is warm and dry. He is not diaphoretic.  Psychiatric: He has a normal mood and affect.    ED Course  Procedures (including critical care time) Labs Review Labs Reviewed  CBC WITH DIFFERENTIAL - Abnormal; Notable for the following:     Neutrophils Relative % 38 (*)    Lymphocytes Relative 49 (*)    Basophils Relative 2 (*)    All other components within normal limits  URINALYSIS, ROUTINE W REFLEX MICROSCOPIC - Abnormal; Notable for the following:    APPearance CLOUDY (*)    Hgb urine dipstick TRACE (*)    All other components within normal limits  COMPREHENSIVE METABOLIC PANEL  URINE MICROSCOPIC-ADD ON    Imaging Review Ct Abdomen Pelvis Wo Contrast  07/21/2014   CLINICAL DATA:  Right-sided abdominal and groin pain. Initial encounter.  EXAM: CT ABDOMEN AND PELVIS WITHOUT CONTRAST  TECHNIQUE: Multidetector CT imaging of the abdomen and pelvis was performed following the standard protocol without IV contrast.  COMPARISON:  CT the chest, abdomen and pelvis - 09/21/2013  FINDINGS: The lack of intravenous contrast limits the ability to evaluate solid abdominal organs.  Normal hepatic contour. Scatter assistance subcentimeter hypoattenuating hepatic lesions are too small to accurately characterize though favored to represent hepatic cysts. Post cholecystectomy. No ascites.  No renal stones. No urinary obstruction or perinephric stranding. Grossly unchanged approximately 3.8 x 3.7 cm hypo attenuating previously enhanced partially exophytic left-sided renal cysts. No urinary obstruction or perinephric stranding.  Normal noncontrast appearance of the bilateral adrenal glands, pancreas and spleen.  Ingestion enteric contrast extends to the level of the cecum. No evidence of enteric obstruction. Scattered colonic diverticulosis without evidence of diverticulitis on this noncontrast examination. The appendix is not definitely identified however there is no inflammatory change within the right lower abdominal quadrant. No pneumoperitoneum, pneumatosis or portal venous gas.  Scattered atherosclerotic plaque within a normal caliber abdominal aorta. No bulky retroperitoneal, mesenteric, pelvic or inguinal lymphadenopathy on this noncontrast  examination.  Normal noncontrast appearance of the pelvic organs. No free fluid in the pelvic cul-de-sac.  Limited visualization of lower thorax demonstrates minimal bibasilar dependent subsegmental atelectasis. No discrete focal airspace opacities. No pleural effusion.  Normal heart size.  No pericardial effusion.  No acute or aggressive osseus abnormalities.  Small bilateral mesenteric fat containing inguinal hernias.  IMPRESSION: 1. No definite explanation for patient's right-sided abdominal pain. Specifically, no evidence of enteric or urinary obstruction. No evidence of nephrolithiasis. 2. Small bilateral mesenteric fat containing inguinal hernias.   Electronically Signed   By: Simonne Come M.D.   On: 07/21/2014 20:36     EKG Interpretation None      MDM   Final diagnoses:  Abdominal pain, unspecified abdominal location  Bilateral inguinal hernia without obstruction or gangrene, recurrence not specified   58 year old male with recurrent right lower quadrant abdominal pain after having hernia reduced to 4 days ago in the emergency department. He denies any recurrence of hernia.  On exam, no protruding hernia and abdominal wall remains soft, small defect noted.  Lab work was obtained, overall reassuring.  Trace hemoglobin in urine-- patient does have prior history of kidney stones. CT abdomen pelvis without contrast was obtained for further evaluation, no evidence of stone or urinary obstruction. He does have bilateral mesenteric fat containing  inguinal hernias, no bowel involvement or signs of strangulation.  Pain has been controlled in the emergency department and he has tolerated PO. Patient will be discharged home with further pain control and followup with [facility] for evaluation for hernia repair.  Discussed plan with patient, he/she acknowledged understanding and agreed with plan of care.  Return precautions given for new or worsening symptoms.  Case discussed with attending  physician, Dr. Littie DeedsGentry, who personally evaluated patient and agrees with assessment/plan of care.  Garlon HatchetLisa M Tinslee Klare, PA-C 07/21/14 2232

## 2014-07-21 NOTE — Discharge Instructions (Signed)
Take the prescribed medication as directed.  Make sure to eat when taking medication to prevent nausea.  May use zofran when needed. [facility] if continue having problems with hernia-- call and schedule appt. Return to the ED for new or worsening symptoms.

## 2014-07-21 NOTE — ED Notes (Signed)
Pt c/o right abd pain with pain in groin and side; pt seen here for same last week and told had hernia

## 2014-07-21 NOTE — ED Notes (Signed)
Pt finished contrast and called CT

## 2014-07-22 NOTE — ED Provider Notes (Signed)
Medical screening examination/treatment/procedure(s) were conducted as a shared visit with non-physician practitioner(s) and myself.  I personally evaluated the patient during the encounter.   EKG Interpretation None       Briefly, pt is a 58 y.o. male presenting with bil lq R>L abd tenderness and pain.  I performed an examination on the patient including cardiac, pulmonary, and gi systems which were remarkable for RLQ and LLQ tenderness.  CT scan obtained which demonstrated bil hernias, no hydronephrosis, nephrolithiasis, or appendicitis.  DC home in stable condition to fu with surgery for symptomatic hernias.     Mirian MoMatthew Gentry, MD 07/22/14 [id number]

## 2014-11-07 ENCOUNTER — Encounter (HOSPITAL_COMMUNITY): Payer: Self-pay | Admitting: Emergency Medicine

## 2014-11-07 ENCOUNTER — Emergency Department (INDEPENDENT_AMBULATORY_CARE_PROVIDER_SITE_OTHER)
Admission: EM | Admit: 2014-11-07 | Discharge: 2014-11-07 | Disposition: A | Discharge status: Home / Self Care | Payer: Self-pay | Source: Home / Self Care | Attending: Family Medicine | Admitting: Family Medicine

## 2014-11-07 DIAGNOSIS — J329 Chronic sinusitis, unspecified: Secondary | ICD-10-CM

## 2014-11-07 DIAGNOSIS — T485X5A Adverse effect of other anti-common-cold drugs, initial encounter: Principal | ICD-10-CM

## 2014-11-07 DIAGNOSIS — T485X1A Poisoning by other anti-common-cold drugs, accidental (unintentional), initial encounter: Secondary | ICD-10-CM

## 2014-11-07 DIAGNOSIS — J310 Chronic rhinitis: Secondary | ICD-10-CM

## 2014-11-07 MED ORDER — PREDNISONE 10 MG PO KIT: PACK | ORAL | Status: DC

## 2014-11-07 MED ORDER — FLUTICASONE PROPIONATE 50 MCG/ACT NA SUSP: 2.0000 | Freq: Every day | NASAL | Status: DC

## 2014-11-07 NOTE — Discharge Instructions (Signed)
Sinusitis Stop the Afrin Use lots of saline nasal spray Lots of oral fluids Sudafed PE 10 mg a day Zyrtec  or Allegra 180 mg a day Sinusitis is redness, soreness, and puffiness (inflammation) of the air pockets in the bones of your face (sinuses). The redness, soreness, and puffiness can cause air and mucus to get trapped in your sinuses. This can allow germs to grow and cause an infection.  HOME CARE   Drink enough fluids to keep your pee (urine) clear or pale yellow.  Use a humidifier in your home.  Run a hot shower to create steam in the bathroom. Sit in the bathroom with the door closed. Breathe in the steam 3-4 times a day.  Put a warm, moist washcloth on your face 3-4 times a day, or as told by your doctor.  Use salt water sprays (saline sprays) to wet the thick fluid in your nose. This can help the sinuses drain.  Only take medicine as told by your doctor. GET HELP RIGHT AWAY IF:   Your pain gets worse.  You have very bad headaches.  You are sick to your stomach (nauseous).  You throw up (vomit).  You are very sleepy (drowsy) all the time.  Your face is puffy (swollen).  Your vision changes.  You have a stiff neck.  You have trouble breathing. MAKE SURE YOU:   Understand these instructions.  Will watch your condition.  Will get help right away if you are not doing well or get worse. Document Released: 03/14/2008 Document Revised: 06/20/2012 Document Reviewed: 05/01/2012 [name] Patient Information 2015 [place], [place]. This information is not intended to replace advice given to you by your health care provider. Make sure you discuss any questions you have with your health care provider.  Upper Respiratory Infection, Adult An upper respiratory infection (URI) is also sometimes known as the common cold. The upper respiratory tract includes the nose, sinuses, throat, trachea, and bronchi. Bronchi are the airways leading to the lungs. Most people improve  within 1 week, but symptoms can last up to 2 weeks. A residual cough may last even longer.  CAUSES Many different viruses can infect the tissues lining the upper respiratory tract. The tissues become irritated and inflamed and often become very moist. Mucus production is also common. A cold is contagious. You can easily spread the virus to others by oral contact. This includes kissing, sharing a glass, coughing, or sneezing. Touching your mouth or nose and then touching a surface, which is then touched by another person, can also spread the virus. SYMPTOMS  Symptoms typically develop 1 to 3 days after you come in contact with a cold virus. Symptoms vary from person to person. They may include:  Runny nose.  Sneezing.  Nasal congestion.  Sinus irritation.  Sore throat.  Loss of voice (laryngitis).  Cough.  Fatigue.  Muscle aches.  Loss of appetite.  Headache.  Low-grade fever. DIAGNOSIS  You might diagnose your own cold based on familiar symptoms, since most people get a cold 2 to 3 times a year. Your caregiver can confirm this based on your exam. Most importantly, your caregiver can check that your symptoms are not due to another disease such as strep throat, sinusitis, pneumonia, asthma, or epiglottitis. Blood tests, throat tests, and X-rays are not necessary to diagnose a common cold, but they may sometimes be helpful in excluding other more serious diseases. Your caregiver will decide if any further tests are required. RISKS AND COMPLICATIONS  You  may be at risk for a more severe case of the common cold if you smoke cigarettes, have chronic heart disease (such as heart failure) or lung disease (such as asthma), or if you have a weakened immune system. The very young and very old are also at risk for more serious infections. Bacterial sinusitis, middle ear infections, and bacterial pneumonia can complicate the common cold. The common cold can worsen asthma and chronic obstructive  pulmonary disease (COPD). Sometimes, these complications can require emergency medical care and may be life-threatening. PREVENTION  The best way to protect against getting a cold is to practice good hygiene. Avoid oral or hand contact with people with cold symptoms. Wash your hands often if contact occurs. There is no clear evidence that vitamin C, vitamin E, echinacea, or exercise reduces the chance of developing a cold. However, it is always recommended to get plenty of rest and practice good nutrition. TREATMENT  Treatment is directed at relieving symptoms. There is no cure. Antibiotics are not effective, because the infection is caused by a virus, not by bacteria. Treatment may include:  Increased fluid intake. Sports drinks offer valuable electrolytes, sugars, and fluids.  Breathing heated mist or steam (vaporizer or shower).  Eating chicken soup or other clear broths, and maintaining good nutrition.  Getting plenty of rest.  Using gargles or lozenges for comfort.  Controlling fevers with ibuprofen or acetaminophen as directed by your caregiver.  Increasing usage of your inhaler if you have asthma. Zinc gel and zinc lozenges, taken in the first 24 hours of the common cold, can shorten the duration and lessen the severity of symptoms. Pain medicines may help with fever, muscle aches, and throat pain. A variety of non-prescription medicines are available to treat congestion and runny nose. Your caregiver can make recommendations and may suggest nasal or lung inhalers for other symptoms.  HOME CARE INSTRUCTIONS   Only take over-the-counter or prescription medicines for pain, discomfort, or fever as directed by your caregiver.  Use a warm mist humidifier or inhale steam from a shower to increase air moisture. This may keep secretions moist and make it easier to breathe.  Drink enough water and fluids to keep your urine clear or pale yellow.  Rest as needed.  Return to work when your  temperature has returned to normal or as your caregiver advises. You may need to stay home longer to avoid infecting others. You can also use a face mask and careful hand washing to prevent spread of the virus. SEEK MEDICAL CARE IF:   After the first few days, you feel you are getting worse rather than better.  You need your caregiver's advice about medicines to control symptoms.  You develop chills, worsening shortness of breath, or brown or red sputum. These may be signs of pneumonia.  You develop yellow or brown nasal discharge or pain in the face, especially when you bend forward. These may be signs of sinusitis.  You develop a fever, swollen neck glands, pain with swallowing, or white areas in the back of your throat. These may be signs of strep throat. SEEK IMMEDIATE MEDICAL CARE IF:   You have a fever.  You develop severe or persistent headache, ear pain, sinus pain, or chest pain.  You develop wheezing, a prolonged cough, cough up blood, or have a change in your usual mucus (if you have chronic lung disease).  You develop sore muscles or a stiff neck. Document Released: 03/22/2001 Document Revised: 12/19/2011 Document Reviewed: 01/01/2014 [employer]  Patient Information ©2015 [employer]. This information is not intended to replace advice given to you by your health care provider. Make sure you discuss any questions you have with your health care provider. ° °

## 2014-11-07 NOTE — ED Notes (Signed)
Pt states that he believes he has the flu. Pt states that he has had a cough and ear pain for 3 weeks.

## 2014-11-07 NOTE — ED Provider Notes (Signed)
CSN: [id number]     Arrival date & time 11/07/14  1443 History   First MD Initiated Contact with Patient 11/07/14 1543     Chief Complaint  Patient presents with  . Cough  . Otalgia   (Consider location/radiation/quality/duration/timing/severity/associated sxs/prior Treatment) HPI Comments: 59 year old male complaining of scratchy throat, PND, Duffy sinuses, patient pain, runny nose for 3 weeks. He states he has an earache on the right side but denies fever denies smoking. Interestingly he has been using a friend nasal spray on a daily basis since he was 16 years old. He has been using Mucinex tablets but they are not helping his symptoms. He states that he has been evaluated by an ENT and was told that long-term use of the day for nasal spray has eroded the mucosa of his nasal passages and is causing chronic inflammation.   History reviewed. No pertinent past medical history. Past Surgical History  Procedure Laterality Date  . Rotator cuff repair    . Cholecystectomy    . Knee surgery     History reviewed. No pertinent family history. History  Substance Use Topics  . Smoking status: Current Every Day Smoker -- 0.25 packs/day    Types: Cigarettes  . Smokeless tobacco: Not on file  . Alcohol Use: No    Review of Systems  Constitutional: Negative for fever, diaphoresis, activity change and fatigue.  HENT: Positive for congestion, ear pain, postnasal drip, rhinorrhea and sinus pressure. Negative for ear discharge, facial swelling, sore throat and trouble swallowing.   Eyes: Negative for pain, discharge and redness.  Respiratory: Positive for cough. Negative for chest tightness and shortness of breath.   Cardiovascular: Negative.   Gastrointestinal: Negative.   Musculoskeletal: Negative.  Negative for neck pain and neck stiffness.  Neurological: Negative.     Allergies  Codeine  Home Medications   Prior to Admission medications   Medication Sig Start Date End Date Taking?  Authorizing Provider  cetirizine (ZYRTEC) 10 MG chewable tablet Chew 10 mg by mouth daily as needed for allergies.    Historical Provider, MD  fluticasone (FLONASE) 50 MCG/ACT nasal spray Place 2 sprays into both nostrils daily. 11/07/14   Janne Napoleon, NP  naproxen sodium (ANAPROX) 220 MG tablet Take 220 mg by mouth 2 (two) times daily as needed (for pain).     Historical Provider, MD  ondansetron (ZOFRAN) 4 MG tablet Take 1 tablet (4 mg total) by mouth every 6 (six) hours. 07/21/14   Larene Pickett, PA-C  PredniSONE 10 MG KIT Take as directed   Disp 12 d kit 11/07/14   Janne Napoleon, NP  promethazine (PHENERGAN) 25 MG tablet Take 1 tablet (25 mg total) by mouth every 6 (six) hours as needed for nausea. 07/17/14   Jasper Riling. Pickering, MD   BP 159/82 mmHg  Pulse 66  Temp(Src) 98.5 F (36.9 C) (Oral)  Resp 18  SpO2 98% Physical Exam  Constitutional: He is oriented to person, place, and time. He appears well-developed and well-nourished. No distress.  HENT:  Mouth/Throat: No oropharyngeal exudate.  Right TM with mild retraction. Left TM is normal Oropharynx with cobblestoning and minor erythema with clear PND.  Eyes: Conjunctivae and EOM are normal.  Neck: Normal range of motion. Neck supple.  Cardiovascular: Normal rate, regular rhythm and normal heart sounds.   Pulmonary/Chest: Effort normal and breath sounds normal. No respiratory distress. He has no wheezes. He has no rales.  Musculoskeletal: Normal range of motion. He exhibits no edema.  Lymphadenopathy:    He has no cervical adenopathy.  Neurological: He is alert and oriented to person, place, and time.  Skin: Skin is warm and dry. No rash noted.  Psychiatric: He has a normal mood and affect.  Nursing note and vitals reviewed.   ED Course  Procedures (including critical care time) Labs Review Labs Reviewed - No data to display  Imaging Review No results found.   MDM   1. Rhinitis medicamentosa   2. Rhinosinusitis      Stop the Afrin Use lots of saline nasal spray Lots of oral fluids Sudafed PE 10 mg a day Zyrtec 24m or Allegra 180 mg a day   DJanne Napoleon NP 11/07/14 1605

## 2015-04-15 ENCOUNTER — Emergency Department (HOSPITAL_COMMUNITY)
Admission: EM | Admit: 2015-04-15 | Discharge: 2015-04-15 | Disposition: A | Discharge status: Home / Self Care | Payer: Self-pay | Attending: Emergency Medicine | Admitting: Emergency Medicine

## 2015-04-15 ENCOUNTER — Encounter (HOSPITAL_COMMUNITY): Payer: Self-pay | Admitting: *Deleted

## 2015-04-15 DIAGNOSIS — R51 Headache: Secondary | ICD-10-CM | POA: Insufficient documentation

## 2015-04-15 DIAGNOSIS — Z7951 Long term (current) use of inhaled steroids: Secondary | ICD-10-CM | POA: Insufficient documentation

## 2015-04-15 DIAGNOSIS — Z79899 Other long term (current) drug therapy: Secondary | ICD-10-CM | POA: Insufficient documentation

## 2015-04-15 DIAGNOSIS — R110 Nausea: Secondary | ICD-10-CM | POA: Insufficient documentation

## 2015-04-15 DIAGNOSIS — Z720 Tobacco use: Secondary | ICD-10-CM | POA: Insufficient documentation

## 2015-04-15 DIAGNOSIS — R21 Rash and other nonspecific skin eruption: Secondary | ICD-10-CM | POA: Insufficient documentation

## 2015-04-15 DIAGNOSIS — R42 Dizziness and giddiness: Secondary | ICD-10-CM | POA: Insufficient documentation

## 2015-04-15 MED ORDER — PREDNISONE 20 MG PO TABS
60.0000 mg | ORAL_TABLET | Freq: Once | ORAL | Status: AC
  Administered 2015-04-15: 60 mg via ORAL
  Filled 2015-04-15: qty 3

## 2015-04-15 MED ORDER — SULFAMETHOXAZOLE-TRIMETHOPRIM 800-160 MG PO TABS: 1.0000 | ORAL_TABLET | Freq: Two times a day (BID) | ORAL | Status: AC

## 2015-04-15 MED ORDER — PREDNISONE 20 MG PO TABS: 40.0000 mg | ORAL_TABLET | Freq: Every day | ORAL | Status: DC

## 2015-04-15 MED ORDER — SULFAMETHOXAZOLE-TRIMETHOPRIM 800-160 MG PO TABS
1.0000 | ORAL_TABLET | Freq: Once | ORAL | Status: AC
  Administered 2015-04-15: 1 via ORAL
  Filled 2015-04-15: qty 1

## 2015-04-15 NOTE — ED Provider Notes (Signed)
CSN: [id number]     Arrival date & time 04/15/15  1530 History  This chart was scribed for Montine Circle, PA-C working with Lajean Saver, MD by Randa Evens, ED Scribe. This patient was seen in room TR08C/TR08C and the patient's care was started at 4:01 PM.    Chief Complaint  Patient presents with  . Rash   The history is provided by the patient. No language interpreter was used.   HPI Comments: Leonard Rowe is a 59 y.o. male who presents to the Emergency Department complaining of spreading  rash on the top of his scalp onset 1 week prior. Pt states that the rash has recently spread down the back of his neck 2 days ago. He describes pain on the back of his neck as a burning numbness. Pt does report some intermittent nausea, dizziness and HA the past 2 days. Pt doesn't report any medications tried PTA. Pt doesn't report any alleviating factors. Pt states that he has a Hx shingles on his left side of his abdomen. Pt denies fever or vomiting.   No past medical history on file. Past Surgical History  Procedure Laterality Date  . Rotator cuff repair    . Cholecystectomy    . Knee surgery     No family history on file. History  Substance Use Topics  . Smoking status: Current Every Day Smoker -- 0.25 packs/day    Types: Cigarettes  . Smokeless tobacco: Not on file  . Alcohol Use: No    Review of Systems  Constitutional: Negative for fever.  Gastrointestinal: Positive for nausea. Negative for vomiting.  Skin: Positive for rash.  Neurological: Positive for dizziness and headaches.     Allergies  Codeine  Home Medications   Prior to Admission medications   Medication Sig Start Date End Date Taking? Authorizing Provider  cetirizine (ZYRTEC) 10 MG chewable tablet Chew 10 mg by mouth daily as needed for allergies.    Historical Provider, MD  fluticasone (FLONASE) 50 MCG/ACT nasal spray Place 2 sprays into both nostrils daily. 11/07/14   Janne Napoleon, NP  naproxen sodium  (ANAPROX) 220 MG tablet Take 220 mg by mouth 2 (two) times daily as needed (for pain).     Historical Provider, MD  ondansetron (ZOFRAN) 4 MG tablet Take 1 tablet (4 mg total) by mouth every 6 (six) hours. 07/21/14   Larene Pickett, PA-C  PredniSONE 10 MG KIT Take as directed   Disp 12 d kit 11/07/14   Janne Napoleon, NP  promethazine (PHENERGAN) 25 MG tablet Take 1 tablet (25 mg total) by mouth every 6 (six) hours as needed for nausea. 07/17/14   Davonna Belling, MD   BP 165/94 mmHg  Pulse 74  Temp(Src) 98.6 F (37 C) (Oral)  Resp 20  SpO2 99%   Physical Exam  Constitutional: He is oriented to person, place, and time. He appears well-developed and well-nourished. No distress.  HENT:  Head: Normocephalic and atraumatic.  Eyes: Conjunctivae and EOM are normal.  Neck: Neck supple. No tracheal deviation present.  Cardiovascular: Normal rate.   Pulmonary/Chest: Effort normal. No respiratory distress.  Musculoskeletal: Normal range of motion.  Neurological: He is alert and oriented to person, place, and time.  Skin: Skin is warm and dry. Rash noted.  Several small honey crusted lesions on the scalp, no obvious abscess, no sign of cellulitis  Psychiatric: He has a normal mood and affect. His behavior is normal.  Nursing note and vitals reviewed.   ED  Course  Procedures (including critical care time) DIAGNOSTIC STUDIES: Oxygen Saturation is 99% on RA, normal by my interpretation.    COORDINATION OF CARE: 4:20 PM-Discussed treatment plan with pt at bedside and pt agreed to plan.     Labs Review Labs Reviewed - No data to display  Imaging Review No results found.   EKG Interpretation None      MDM   Final diagnoses:  Rash   Patient with scattered vesicular rash on left side of scalp.  The rash has been present for 2 weeks.  Consider contact dermatitis w/overlying mild infection vs shingles.  Will give bactrim and prednisone.  Recommend PCP follow-up as needed.  Patient  understands and agrees with the plan.  Filed Vitals:   04/15/15 1547  BP: 165/94  Pulse: 74  Temp: 98.6 F (37 C)  Resp: 20   I personally performed the services described in this documentation, which was scribed in my presence. The recorded information has been reviewed and is accurate.         Montine Circle, PA-C 04/15/15 [place], MD 04/20/15 [phone]

## 2015-04-15 NOTE — ED Notes (Signed)
Pt reports hehas multiple sores on the scalp and larger area at base of neck. Pt reports his scalp and down the back of neck feels numb. No drainage seen at sites.

## 2015-04-15 NOTE — ED Notes (Signed)
Declined W/C at D/C and was escorted to lobby by RN. 

## 2015-04-15 NOTE — Discharge Instructions (Signed)

## 2015-06-01 ENCOUNTER — Encounter (HOSPITAL_COMMUNITY): Payer: Self-pay | Admitting: Emergency Medicine

## 2015-06-01 ENCOUNTER — Emergency Department (INDEPENDENT_AMBULATORY_CARE_PROVIDER_SITE_OTHER)
Admission: EM | Admit: 2015-06-01 | Discharge: 2015-06-01 | Disposition: A | Discharge status: Home / Self Care | Payer: Self-pay | Source: Home / Self Care | Attending: Family Medicine | Admitting: Family Medicine

## 2015-06-01 DIAGNOSIS — R21 Rash and other nonspecific skin eruption: Secondary | ICD-10-CM

## 2015-06-01 DIAGNOSIS — L739 Follicular disorder, unspecified: Secondary | ICD-10-CM

## 2015-06-01 MED ORDER — DEXAMETHASONE 4 MG PO TABS
10.0000 mg | ORAL_TABLET | Freq: Once | ORAL | Status: AC
  Administered 2015-06-01: 20:00:00 10 mg via ORAL

## 2015-06-01 MED ORDER — DEXAMETHASONE 4 MG PO TABS
ORAL_TABLET | ORAL | Status: AC
  Filled 2015-06-01: qty 2

## 2015-06-01 MED ORDER — ACYCLOVIR 800 MG PO TABS: 800.0000 mg | ORAL_TABLET | Freq: Every day | ORAL | Status: DC

## 2015-06-01 MED ORDER — DEXAMETHASONE 2 MG PO TABS
ORAL_TABLET | ORAL | Status: AC
  Filled 2015-06-01: qty 1

## 2015-06-01 MED ORDER — CLINDAMYCIN HCL 300 MG PO CAPS: 300.0000 mg | ORAL_CAPSULE | Freq: Three times a day (TID) | ORAL | Status: DC

## 2015-06-01 NOTE — Discharge Instructions (Signed)
You likely have a shingles outbreak which has become infected causing folliculitis and a deep tissue bacterial infection. Please use the antibiotics for the bacterial infection. Please take the acyclovir for the [facility] infection. I am giving you a refill on the shingles medicine which I want you to take the next time when this rash starts. Please use of Benadryl if needed tonight to help you sleep as the steroids your given may keep you weight.
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
## 2015-06-01 NOTE — ED Notes (Signed)
C/o rash States he was seen in ER one month ago States bacterial skin infection  States sx never fully went away

## 2015-11-08 ENCOUNTER — Emergency Department (HOSPITAL_COMMUNITY): Payer: Self-pay

## 2015-11-08 ENCOUNTER — Emergency Department (HOSPITAL_COMMUNITY)
Admission: EM | Admit: 2015-11-08 | Discharge: 2015-11-08 | Disposition: A | Discharge status: Home / Self Care | Payer: Self-pay | Attending: Emergency Medicine | Admitting: Emergency Medicine

## 2015-11-08 ENCOUNTER — Encounter (HOSPITAL_COMMUNITY): Payer: Self-pay | Admitting: *Deleted

## 2015-11-08 DIAGNOSIS — F17210 Nicotine dependence, cigarettes, uncomplicated: Secondary | ICD-10-CM | POA: Insufficient documentation

## 2015-11-08 DIAGNOSIS — S6991XA Unspecified injury of right wrist, hand and finger(s), initial encounter: Secondary | ICD-10-CM | POA: Insufficient documentation

## 2015-11-08 DIAGNOSIS — W01190A Fall on same level from slipping, tripping and stumbling with subsequent striking against furniture, initial encounter: Secondary | ICD-10-CM | POA: Insufficient documentation

## 2015-11-08 DIAGNOSIS — Y9383 Activity, rough housing and horseplay: Secondary | ICD-10-CM | POA: Insufficient documentation

## 2015-11-08 DIAGNOSIS — M79641 Pain in right hand: Secondary | ICD-10-CM

## 2015-11-08 DIAGNOSIS — Z792 Long term (current) use of antibiotics: Secondary | ICD-10-CM | POA: Insufficient documentation

## 2015-11-08 DIAGNOSIS — Y998 Other external cause status: Secondary | ICD-10-CM | POA: Insufficient documentation

## 2015-11-08 DIAGNOSIS — Z79899 Other long term (current) drug therapy: Secondary | ICD-10-CM | POA: Insufficient documentation

## 2015-11-08 DIAGNOSIS — Y9289 Other specified places as the place of occurrence of the external cause: Secondary | ICD-10-CM | POA: Insufficient documentation

## 2015-11-08 MED ORDER — IBUPROFEN 400 MG PO TABS
800.0000 mg | ORAL_TABLET | Freq: Once | ORAL | Status: AC
  Administered 2015-11-08: 800 mg via ORAL
  Filled 2015-11-08: qty 2

## 2015-11-08 MED ORDER — IBUPROFEN 800 MG PO TABS: 800.0000 mg | ORAL_TABLET | Freq: Three times a day (TID) | ORAL | Status: DC

## 2015-11-08 NOTE — ED Notes (Signed)
SEE PA assessment 

## 2015-11-08 NOTE — Discharge Instructions (Signed)
Hand Contusion  A hand contusion is a deep bruise on your hand area. Contusions are the result of an injury that caused bleeding under the skin. The contusion may turn blue, purple, or yellow. Minor injuries will give you a painless contusion, but more severe contusions may stay painful and swollen for a few weeks.  CAUSES   A contusion is usually caused by a blow, trauma, or direct force to an area of the body.  SYMPTOMS    Swelling and redness of the injured area.   Discoloration of the injured area.   Tenderness and soreness of the injured area.   Pain.  DIAGNOSIS   The diagnosis can be made by taking a history and performing a physical exam. An X-ray, CT scan, or MRI may be needed to determine if there were any associated injuries, such as broken bones (fractures).  TREATMENT   Often, the best treatment for a hand contusion is resting, elevating, icing, and applying cold compresses to the injured area. Over-the-counter medicines may also be recommended for pain control.  HOME CARE INSTRUCTIONS    Put ice on the injured area.    Put ice in a plastic bag.    Place a towel between your skin and the bag.    Leave the ice on for 15-20 minutes, 03-04 times a day.   Only take over-the-counter or prescription medicines as directed by your caregiver. Your caregiver may recommend avoiding anti-inflammatory medicines (aspirin, ibuprofen, and naproxen) for 48 hours because these medicines may increase bruising.   If told, use an elastic wrap as directed. This can help reduce swelling. You may remove the wrap for sleeping, showering, and bathing. If your fingers become numb, cold, or blue, take the wrap off and reapply it more loosely.   Elevate your hand with pillows to reduce swelling.   Avoid overusing your hand if it is painful.  SEEK IMMEDIATE MEDICAL CARE IF:    You have increased redness, swelling, or pain in your hand.   Your swelling or pain is not relieved with medicines.   You have loss of feeling in  your hand or are unable to move your fingers.   Your hand turns cold or blue.   You have pain when you move your fingers.   Your hand becomes warm to the touch.   Your contusion does not improve in 2 days.  MAKE SURE YOU:    Understand these instructions.   Will watch your condition.   Will get help right away if you are not doing well or get worse.     This information is not intended to replace advice given to you by your health care provider. Make sure you discuss any questions you have with your health care provider.     Document Released: 03/18/2002 Document Revised: 06/20/2012 Document Reviewed: 03/19/2012  Elsevier Interactive Patient Education 2016 [employer]

## 2015-11-08 NOTE — ED Notes (Signed)
PT fell on to coffee table and now his RT hand is swollen and painful,. Pt moves all fingers. Pulses present.

## 2015-11-08 NOTE — ED Notes (Signed)
Declined W/C at D/C and was escorted to lobby by RN. 

## 2015-11-08 NOTE — ED Provider Notes (Signed)
CSN: [id number]     Arrival date & time 11/08/15  0957 History  By signing my name below, I, Leonard Rowe, attest that this documentation has been prepared under the direction and in the presence of Leonard Fowler, PA-C Electronically Signed: Soijett Rowe, ED Scribe. 11/08/2015. 10:21 AM.   Chief Complaint  Patient presents with  . Hand Pain      The history is provided by the patient. No language interpreter was used.    Leonard Rowe Leonard Rowe is a 60 y.o. male who presents to the Emergency Department complaining of constant, moderate, persistent right hand pain onset 5 days ago. He notes that he was horse-playing with his son when he fell onto his right hand on a wood coffee table. He states that 4 days ago there was increased swelling to his dorsal hand that has since partially resolved. He states that he has a burning sensation to his right hand at this time. He reports that his has broken his 5th digit in the past. Pt is having associated symptoms of tingling to the 4th and 5th finger that radiates up his right forearm and swelling to the 4th and 5th right finger. He notes that he has tried ice and icy hot for the relief of his symptoms. He denies hitting his head, LOC, HA, color change, wound, wrist pain, elbow pain, weakness, numbness, and any other symptoms. Denies PMHx of HTN or DM.   History reviewed. No pertinent past medical history. Past Surgical History  Procedure Laterality Date  . Rotator cuff repair    . Cholecystectomy    . Knee surgery     Family History  Problem Relation Age of Onset  . Heart disease Father    Social History  Substance Use Topics  . Smoking status: Current Every Day Smoker -- 0.25 packs/day    Types: Cigarettes  . Smokeless tobacco: None  . Alcohol Use: No    Review of Systems  Musculoskeletal: Positive for joint swelling and arthralgias.  Skin: Negative for color change, rash and wound.  Neurological: Negative for weakness and numbness.        Tingling to right 4th and 5th finger  All other systems reviewed and are negative.     Allergies  Codeine  Home Medications   Prior to Admission medications   Medication Sig Start Date End Date Taking? Authorizing Provider  acyclovir (ZOVIRAX) 800 MG tablet Take 1 tablet (800 mg total) by mouth 5 (five) times daily. 06/01/15   Ozella Rocks, MD  cetirizine (ZYRTEC) 10 MG chewable tablet Chew 10 mg by mouth daily as needed for allergies.    Historical Provider, MD  clindamycin (CLEOCIN) 300 MG capsule Take 1 capsule (300 mg total) by mouth 3 (three) times daily. 06/01/15   Ozella Rocks, MD  naproxen sodium (ANAPROX) 220 MG tablet Take 220 mg by mouth 2 (two) times daily as needed (for pain).     Historical Provider, MD  promethazine (PHENERGAN) 25 MG tablet Take 1 tablet (25 mg total) by mouth every 6 (six) hours as needed for nausea. 07/17/14   Benjiman Core, MD   BP 159/93 mmHg  Pulse 63  Temp(Src) 97.6 F (36.4 C) (Oral)  Resp 16  SpO2 99% Physical Exam  Constitutional: He is oriented to person, place, and time. He appears well-developed and well-nourished.  HENT:  Head: Atraumatic.  Eyes: Conjunctivae are normal. No scleral icterus.  Neck: No tracheal deviation present.  Cardiovascular:  Pulses:  Radial pulses are 2+ on the right side, and 2+ on the left side.  Cap refill less than 3 seconds in right fingers.   Pulmonary/Chest: Effort normal. No respiratory distress.  Abdominal: He exhibits no distension.  Musculoskeletal: Normal range of motion. He exhibits tenderness.       Right elbow: Normal.      Right wrist: Normal.       Right hand: He exhibits tenderness and bony tenderness (over 4th and 5th MCP). He exhibits normal range of motion, normal capillary refill, no deformity and no swelling. Decreased sensation (altered sensation) noted. Decreased sensation is present in the ulnar distribution. Normal strength noted.  Right hand: full ROM of wrist bilaterally.  Able to move all fingers without difficulty. No ecchymosis, bruising, or swelling. Dorsal aspect of right hand TTP along 4th and 5th MCP.  Neurological: He is alert and oriented to person, place, and time.  Strength intact bilaterally throughout upper extremities, 5/5 grip strength bilaterally.  Altered sensation in right 4th and 5th digits; otherwise, normal.   Skin: Skin is warm, dry and intact. No abrasion, no bruising, no ecchymosis and no laceration noted. No erythema.  Skin intact.   Psychiatric: He has a normal mood and affect. His behavior is normal.  Nursing note and vitals reviewed.   ED Course  Procedures (including critical care time) DIAGNOSTIC STUDIES: Oxygen Saturation is 99% on RA, nl by my interpretation.    COORDINATION OF CARE: 10:20 AM Discussed treatment plan with pt at bedside which includes ibuprofen and right hand xray and pt agreed to plan.    Labs Review Labs Reviewed - No data to display  Imaging Review Dg Hand Complete Right  11/08/2015  CLINICAL DATA:  Fall on coffee table 5 days ago. Pain third through fifth metacarpals. EXAM: RIGHT HAND - COMPLETE 3+ VIEW COMPARISON:  None. FINDINGS: There is no evidence of fracture or dislocation. There is no evidence of arthropathy or other focal bone abnormality. Soft tissues are unremarkable. IMPRESSION: Negative. Electronically Signed   By: Charlett Nose M.D.   On: 11/08/2015 11:37   I have personally reviewed and evaluated these images as part of my medical decision-making.   EKG Interpretation None      MDM   Final diagnoses:  Right hand pain    Patient X-Ray negative for obvious fracture or dislocation.  Pt advised to follow up with orthopedics. Patient given ace wrap while in ED, conservative therapy recommended and discussed. Follow up PCP. Patient will be discharged home and is agreeable with above plan. Returns precautions discussed. Pt appears safe for discharge.  I personally performed the services  described in this documentation, which was scribed in my presence. The recorded information has been reviewed and is accurate.    Leonard Fowler, PA-C 11/08/15 1142  Nelva Nay, MD 11/09/15 [phone]

## 2017-04-15 ENCOUNTER — Emergency Department (HOSPITAL_COMMUNITY): Payer: Self-pay

## 2017-04-15 ENCOUNTER — Emergency Department (HOSPITAL_COMMUNITY)
Admission: EM | Admit: 2017-04-15 | Discharge: 2017-04-15 | Disposition: A | Discharge status: Home / Self Care | Payer: Self-pay | Attending: Emergency Medicine | Admitting: Emergency Medicine

## 2017-04-15 ENCOUNTER — Encounter (HOSPITAL_COMMUNITY): Payer: Self-pay | Admitting: Emergency Medicine

## 2017-04-15 DIAGNOSIS — Z87891 Personal history of nicotine dependence: Secondary | ICD-10-CM | POA: Insufficient documentation

## 2017-04-15 DIAGNOSIS — Z9049 Acquired absence of other specified parts of digestive tract: Secondary | ICD-10-CM | POA: Insufficient documentation

## 2017-04-15 DIAGNOSIS — S60462A Insect bite (nonvenomous) of right middle finger, initial encounter: Secondary | ICD-10-CM | POA: Insufficient documentation

## 2017-04-15 DIAGNOSIS — W57XXXA Bitten or stung by nonvenomous insect and other nonvenomous arthropods, initial encounter: Secondary | ICD-10-CM | POA: Insufficient documentation

## 2017-04-15 DIAGNOSIS — Y929 Unspecified place or not applicable: Secondary | ICD-10-CM | POA: Insufficient documentation

## 2017-04-15 DIAGNOSIS — Y9389 Activity, other specified: Secondary | ICD-10-CM | POA: Insufficient documentation

## 2017-04-15 DIAGNOSIS — Y998 Other external cause status: Secondary | ICD-10-CM | POA: Insufficient documentation

## 2017-04-15 DIAGNOSIS — Z79899 Other long term (current) drug therapy: Secondary | ICD-10-CM | POA: Insufficient documentation

## 2017-04-15 MED ORDER — CEPHALEXIN 500 MG PO CAPS: 500.0000 mg | ORAL_CAPSULE | Freq: Four times a day (QID) | ORAL | 0 refills | Status: DC

## 2017-04-15 MED ORDER — SULFAMETHOXAZOLE-TRIMETHOPRIM 800-160 MG PO TABS: 1.0000 | ORAL_TABLET | Freq: Two times a day (BID) | ORAL | 0 refills | Status: AC

## 2017-04-15 MED ORDER — IBUPROFEN 400 MG PO TABS
600.0000 mg | ORAL_TABLET | Freq: Once | ORAL | Status: AC
  Administered 2017-04-15: 16:00:00 600 mg via ORAL
  Filled 2017-04-15: qty 1

## 2017-04-15 MED ORDER — NAPROXEN 500 MG PO TABS: 500.0000 mg | ORAL_TABLET | Freq: Two times a day (BID) | ORAL | 0 refills | Status: DC

## 2017-04-15 NOTE — Discharge Instructions (Signed)
Take the antibiotics as directed and the medication for pain and inflammation. Call Dr. Carollee Massedhompson's office for follow up. If your symptoms worsen return here.

## 2017-04-15 NOTE — ED Provider Notes (Signed)
[facility]EM[facility]Y DEPT Provider Note   CSN: [id number] Arrival date & time: 04/15/17  1456     History   Chief Complaint Chief Complaint  Patient presents with  . Insect Bite  . Abscess    HPI Leonard Rowe is a 61 y.o. male who presents to the ED with pain and swelling of the right middle finger. Patient reports that last night he felt burning and stinging of the area and when he looked he saw a clear blister area with a hole in the center. He does not know if something bit him. Today he reports that the area has increased in size and has turned black. The pain now radiates to the back of his hand. He has taken no medications.   HPI  History reviewed. No pertinent past medical history.  There are no active problems to display for this patient.   Past Surgical History:  Procedure Laterality Date  . CHOLECYSTECTOMY    . KNEE SURGERY    . ROTATOR CUFF REPAIR         Home Medications    Prior to Admission medications   Medication Sig Start Date End Date Taking? Authorizing Provider  acyclovir (ZOVIRAX) 800 MG tablet Take 1 tablet (800 mg total) by mouth 5 (five) times daily. 06/01/15   Ozella RocksMerrell, David J, MD  cephALEXin (KEFLEX) 500 MG capsule Take 1 capsule (500 mg total) by mouth 4 (four) times daily. 04/15/17   Janne NapoleonNeese, Hope M, NP  cetirizine (ZYRTEC) 10 MG chewable tablet Chew 10 mg by mouth daily as needed for allergies.    [provider]  naproxen (NAPROSYN) 500 MG tablet Take 1 tablet (500 mg total) by mouth 2 (two) times daily. 04/15/17   Janne NapoleonNeese, Hope M, NP  promethazine (PHENERGAN) 25 MG tablet Take 1 tablet (25 mg total) by mouth every 6 (six) hours as needed for nausea. 07/17/14   Benjiman CorePickering, Nathan, MD  sulfamethoxazole-trimethoprim (BACTRIM DS,SEPTRA DS) 800-160 MG tablet Take 1 tablet by mouth 2 (two) times daily. 04/15/17 04/25/17  Janne NapoleonNeese, Hope M, NP    Family History Family History  Problem Relation Age of Onset  . Heart disease Father     Social  History Social History  Substance Use Topics  . Smoking status: Former Smoker    Packs/day: 0.25    Types: Cigarettes  . Smokeless tobacco: Never Used  . Alcohol use No     Allergies   Codeine   Review of Systems Review of Systems  Constitutional: Negative for chills and fever.  HENT: Negative for sore throat and trouble swallowing.   Respiratory: Negative for shortness of breath.   Cardiovascular: Negative for chest pain.  Gastrointestinal: Negative for nausea and vomiting.  Musculoskeletal: Negative for myalgias.  Skin: Positive for wound.  Neurological: Negative for headaches.  Psychiatric/Behavioral: The patient is not nervous/anxious.      Physical Exam Updated Vital Signs BP 136/88 (BP Location: Right Arm)   Pulse 62   Temp 97.9 F (36.6 C) (Oral)   Resp 18   Ht 6' (1.829 m)   Wt 77.1 kg (170 lb)   SpO2 100%   BMI 23.06 kg/m   Physical Exam  Constitutional: He is oriented to person, place, and time. He appears well-developed and well-nourished. No distress.  HENT:  Head: Normocephalic and atraumatic.  Eyes: EOM are normal.  Neck: Neck supple.  Cardiovascular: Normal rate.   Pulmonary/Chest: Effort normal.  Musculoskeletal:       Right hand: He exhibits  decreased range of motion, tenderness and swelling. He exhibits no deformity. Normal strength noted. He exhibits no thumb/finger opposition.  Raised dark area to the right middle finger, tender with pain radiating to the dorsum of the hand. Erythema surrounding the area.  Neurological: He is alert and oriented to person, place, and time. No cranial nerve deficit.  Skin: Skin is warm and dry.  Nursing note and vitals reviewed.      ED Treatments / Results  Labs (all labs ordered are listed, but only abnormal results are displayed) Labs Reviewed - No data to display   Radiology Dg Finger Middle Right  Result Date: 04/15/2017 CLINICAL DATA:  Swelling.  Possible bite.  No trauma. EXAM: RIGHT MIDDLE  FINGER 2+V COMPARISON:  11/08/2015 FINDINGS: Soft tissue swelling centered about the proximal interphalangeal joint of the middle finger. No radiopaque foreign object. No acute fracture or dislocation. No focal osseous lesion. IMPRESSION: Soft tissue swelling about the proximal interphalangeal joint. No acute findings. Electronically Signed   By: Jeronimo Greaves M.D.   On: 04/15/2017 17:14    Procedures Procedures (including critical care time)  Medications Ordered in ED Medications  ibuprofen (ADVIL,MOTRIN) tablet 600 mg (600 mg Oral Given 04/15/17 1629)   Dr. Clarene Duke in to examine the patient. Will treat with antibiotics and have patient follow up with the hand surgeon. Return precautions. Patient stable for d/c without red streaking, fever or signs of sepsis.   Initial Impression / Assessment and Plan / ED Course  I have reviewed the triage vital signs and the nursing notes.  Final Clinical Impressions(s) / ED Diagnoses  61 y.o. male with swelling, tenderness and erythema  to the right middle finger appears stable for d/c at this time. Rx Keflex and Bactrim.  Final diagnoses:  Bug bite with infection, initial encounter    New Prescriptions New Prescriptions   CEPHALEXIN (KEFLEX) 500 MG CAPSULE    Take 1 capsule (500 mg total) by mouth 4 (four) times daily.   NAPROXEN (NAPROSYN) 500 MG TABLET    Take 1 tablet (500 mg total) by mouth 2 (two) times daily.   SULFAMETHOXAZOLE-TRIMETHOPRIM (BACTRIM DS,SEPTRA DS) 800-160 MG TABLET    Take 1 tablet by mouth 2 (two) times daily.     Kerrie Buffalo [place], [place] 04/16/17 2130    Laurence Spates, MD 04/16/17 [phone]

## 2017-04-15 NOTE — ED Triage Notes (Signed)
Pt reports possibly bit by some kind of bug. Pt noticed a boil yesterday to his right middle finger. Pt presents with black hardened area to right middle finger.

## 2017-04-15 NOTE — ED Notes (Signed)
Declined W/C at D/C and was escorted to lobby by RN. 

## 2017-04-17 ENCOUNTER — Observation Stay (HOSPITAL_COMMUNITY)
Admission: EM | Admit: 2017-04-17 | Discharge: 2017-04-18 | Disposition: A | Discharge status: Home / Self Care | Payer: Self-pay | Attending: Internal Medicine | Admitting: Internal Medicine

## 2017-04-17 ENCOUNTER — Encounter (HOSPITAL_COMMUNITY): Payer: Self-pay | Admitting: Emergency Medicine

## 2017-04-17 ENCOUNTER — Observation Stay (HOSPITAL_COMMUNITY): Payer: Self-pay | Admitting: Certified Registered"

## 2017-04-17 ENCOUNTER — Encounter (HOSPITAL_COMMUNITY)
Admission: EM | Disposition: A | Discharge status: Home / Self Care | Payer: Self-pay | Source: Home / Self Care | Attending: Emergency Medicine

## 2017-04-17 ENCOUNTER — Other Ambulatory Visit: Payer: Self-pay | Admitting: Orthopedic Surgery

## 2017-04-17 ENCOUNTER — Emergency Department (HOSPITAL_COMMUNITY): Payer: Self-pay

## 2017-04-17 DIAGNOSIS — M7989 Other specified soft tissue disorders: Secondary | ICD-10-CM | POA: Diagnosis present

## 2017-04-17 DIAGNOSIS — Z8249 Family history of ischemic heart disease and other diseases of the circulatory system: Secondary | ICD-10-CM | POA: Insufficient documentation

## 2017-04-17 DIAGNOSIS — L02511 Cutaneous abscess of right hand: Secondary | ICD-10-CM | POA: Insufficient documentation

## 2017-04-17 DIAGNOSIS — Z885 Allergy status to narcotic agent status: Secondary | ICD-10-CM | POA: Insufficient documentation

## 2017-04-17 DIAGNOSIS — W57XXXA Bitten or stung by nonvenomous insect and other nonvenomous arthropods, initial encounter: Secondary | ICD-10-CM | POA: Diagnosis present

## 2017-04-17 DIAGNOSIS — B9562 Methicillin resistant Staphylococcus aureus infection as the cause of diseases classified elsewhere: Principal | ICD-10-CM | POA: Insufficient documentation

## 2017-04-17 DIAGNOSIS — Z1623 Resistance to quinolones and fluoroquinolones: Secondary | ICD-10-CM | POA: Insufficient documentation

## 2017-04-17 DIAGNOSIS — Z87891 Personal history of nicotine dependence: Secondary | ICD-10-CM | POA: Insufficient documentation

## 2017-04-17 DIAGNOSIS — Z1611 Resistance to penicillins: Secondary | ICD-10-CM | POA: Insufficient documentation

## 2017-04-17 DIAGNOSIS — Z79899 Other long term (current) drug therapy: Secondary | ICD-10-CM | POA: Insufficient documentation

## 2017-04-17 DIAGNOSIS — T63331A Toxic effect of venom of brown recluse spider, accidental (unintentional), initial encounter: Secondary | ICD-10-CM | POA: Insufficient documentation

## 2017-04-17 HISTORY — PX: I&D EXTREMITY: SHX5045

## 2017-04-17 LAB — CBC WITH DIFFERENTIAL/PLATELET
BASOS ABS: 0.1 10*3/uL (ref 0.0–0.1)
BASOS PCT: 1 %
Eosinophils Absolute: 0.1 10*3/uL (ref 0.0–0.7)
Eosinophils Relative: 1 %
HEMATOCRIT: 42.6 % (ref 39.0–52.0)
HEMOGLOBIN: 14 g/dL (ref 13.0–17.0)
LYMPHS PCT: 24 %
Lymphs Abs: 2.6 10*3/uL (ref 0.7–4.0)
MCH: 30.4 pg (ref 26.0–34.0)
MCHC: 32.9 g/dL (ref 30.0–36.0)
MCV: 92.6 fL (ref 78.0–100.0)
Monocytes Absolute: 0.7 10*3/uL (ref 0.1–1.0)
Monocytes Relative: 6 %
NEUTROS ABS: 7.3 10*3/uL (ref 1.7–7.7)
NEUTROS PCT: 68 %
Platelets: 328 10*3/uL (ref 150–400)
RBC: 4.6 MIL/uL (ref 4.22–5.81)
RDW: 13.6 % (ref 11.5–15.5)
WBC: 10.7 10*3/uL — AB (ref 4.0–10.5)

## 2017-04-17 LAB — COMPREHENSIVE METABOLIC PANEL
ALBUMIN: 4 g/dL (ref 3.5–5.0)
ALT: 15 U/L — ABNORMAL LOW (ref 17–63)
ANION GAP: 8 (ref 5–15)
AST: 21 U/L (ref 15–41)
Alkaline Phosphatase: 60 U/L (ref 38–126)
BILIRUBIN TOTAL: 0.8 mg/dL (ref 0.3–1.2)
BUN: 10 mg/dL (ref 6–20)
CALCIUM: 9.3 mg/dL (ref 8.9–10.3)
CO2: 27 mmol/L (ref 22–32)
Chloride: 106 mmol/L (ref 101–111)
Creatinine, Ser: 1.05 mg/dL (ref 0.61–1.24)
GLUCOSE: 120 mg/dL — AB (ref 65–99)
POTASSIUM: 4.4 mmol/L (ref 3.5–5.1)
Sodium: 141 mmol/L (ref 135–145)
Total Protein: 7.5 g/dL (ref 6.5–8.1)

## 2017-04-17 LAB — I-STAT CG4 LACTIC ACID, ED: Lactic Acid, Venous: 2.01 mmol/L (ref 0.5–1.9)

## 2017-04-17 IMAGING — CR DG HAND COMPLETE 3+V*R*
3 series · 3 of 3 positions shown · non-contrast
Comparison: [DATE]

CLINICAL DATA: Middle finger, hand swelling, spider bite

EXAM:
RIGHT HAND - COMPLETE 3+ VIEW

[hand pa]
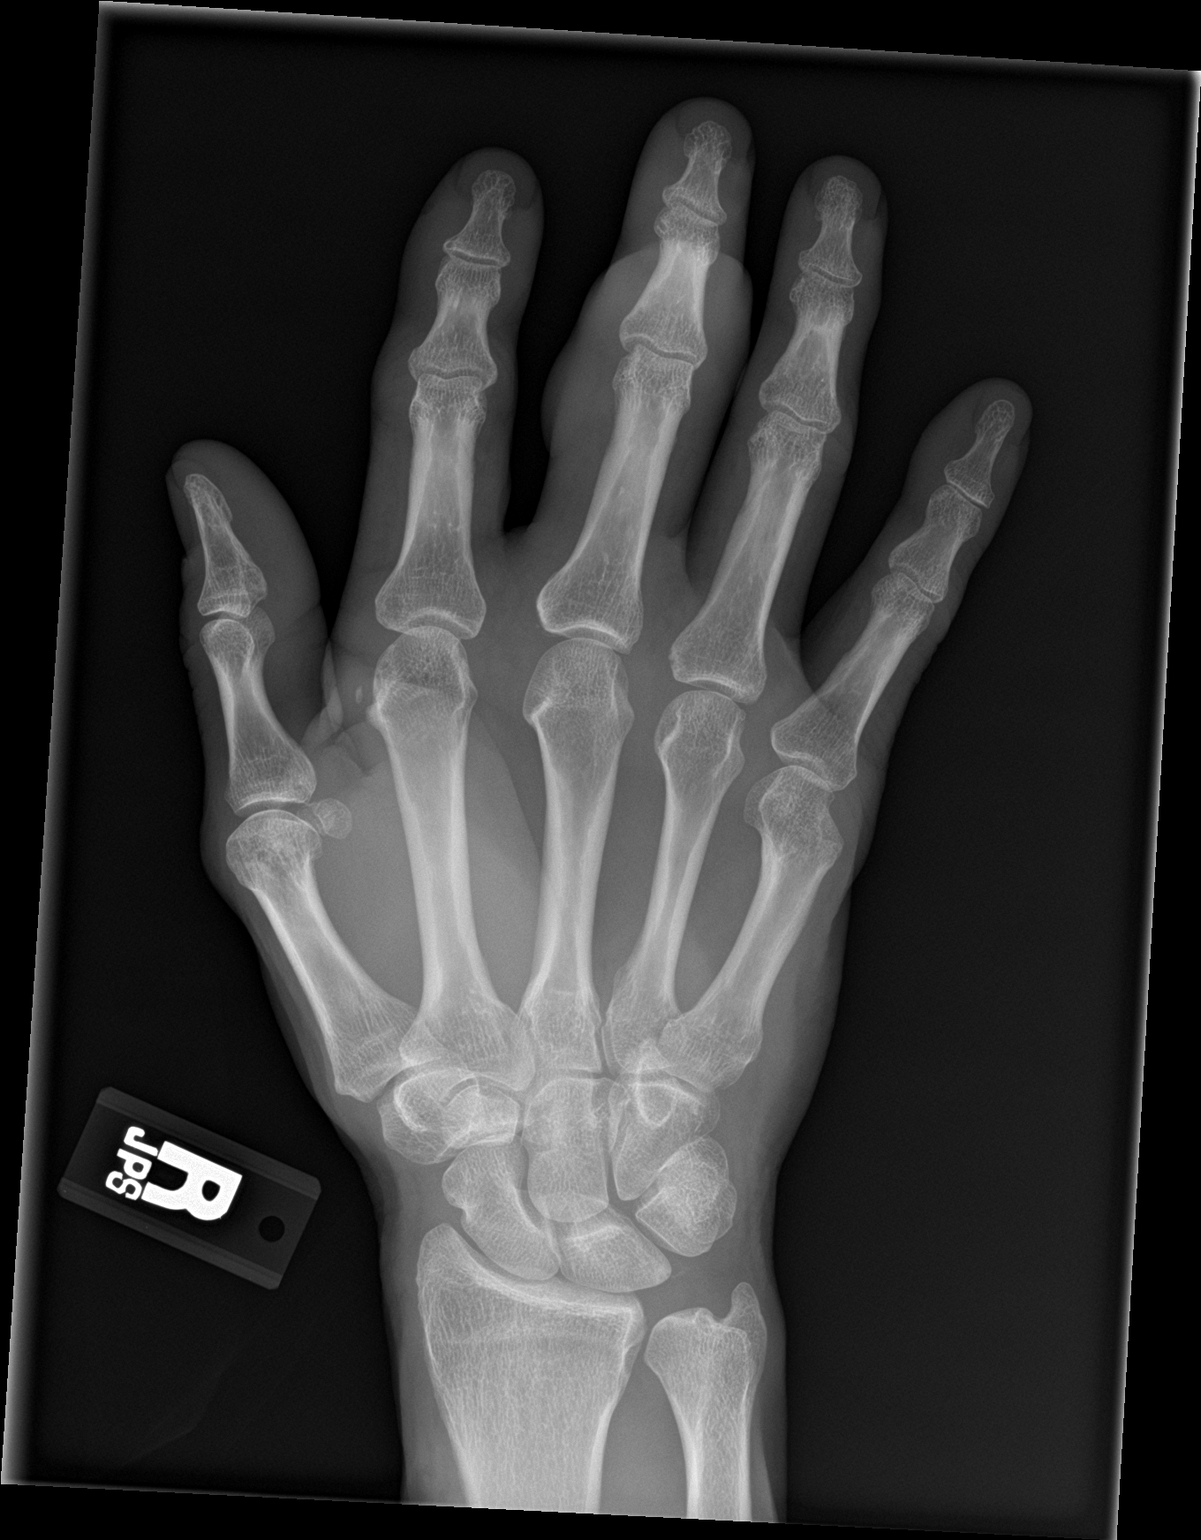

[hand obl]
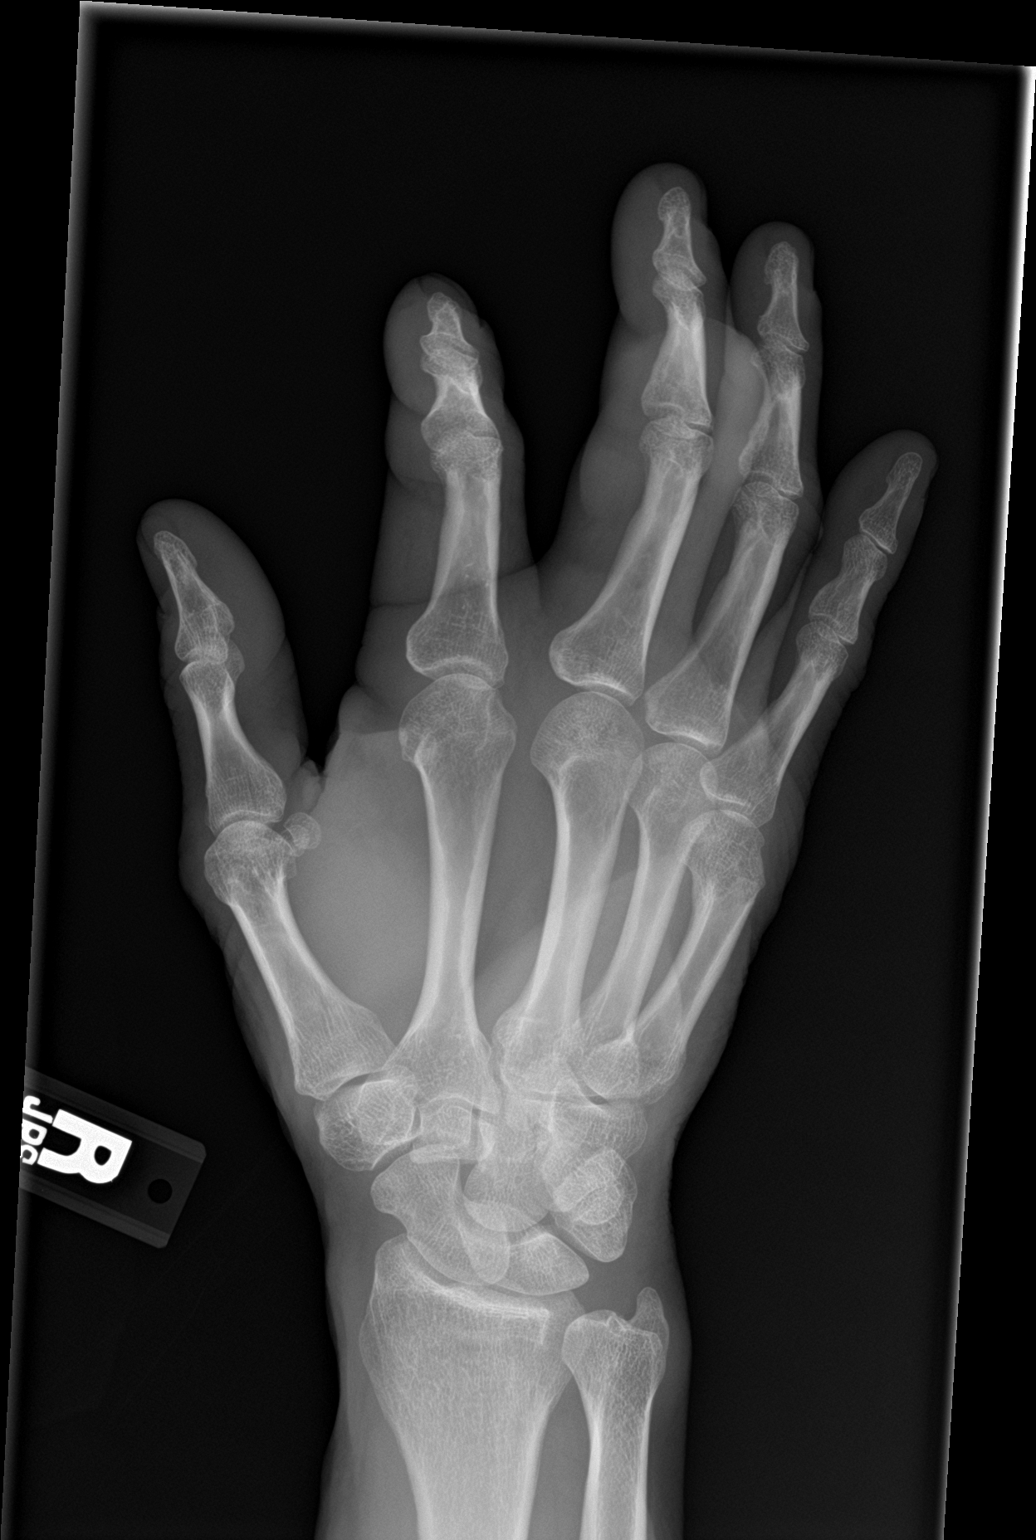

[hand lat]
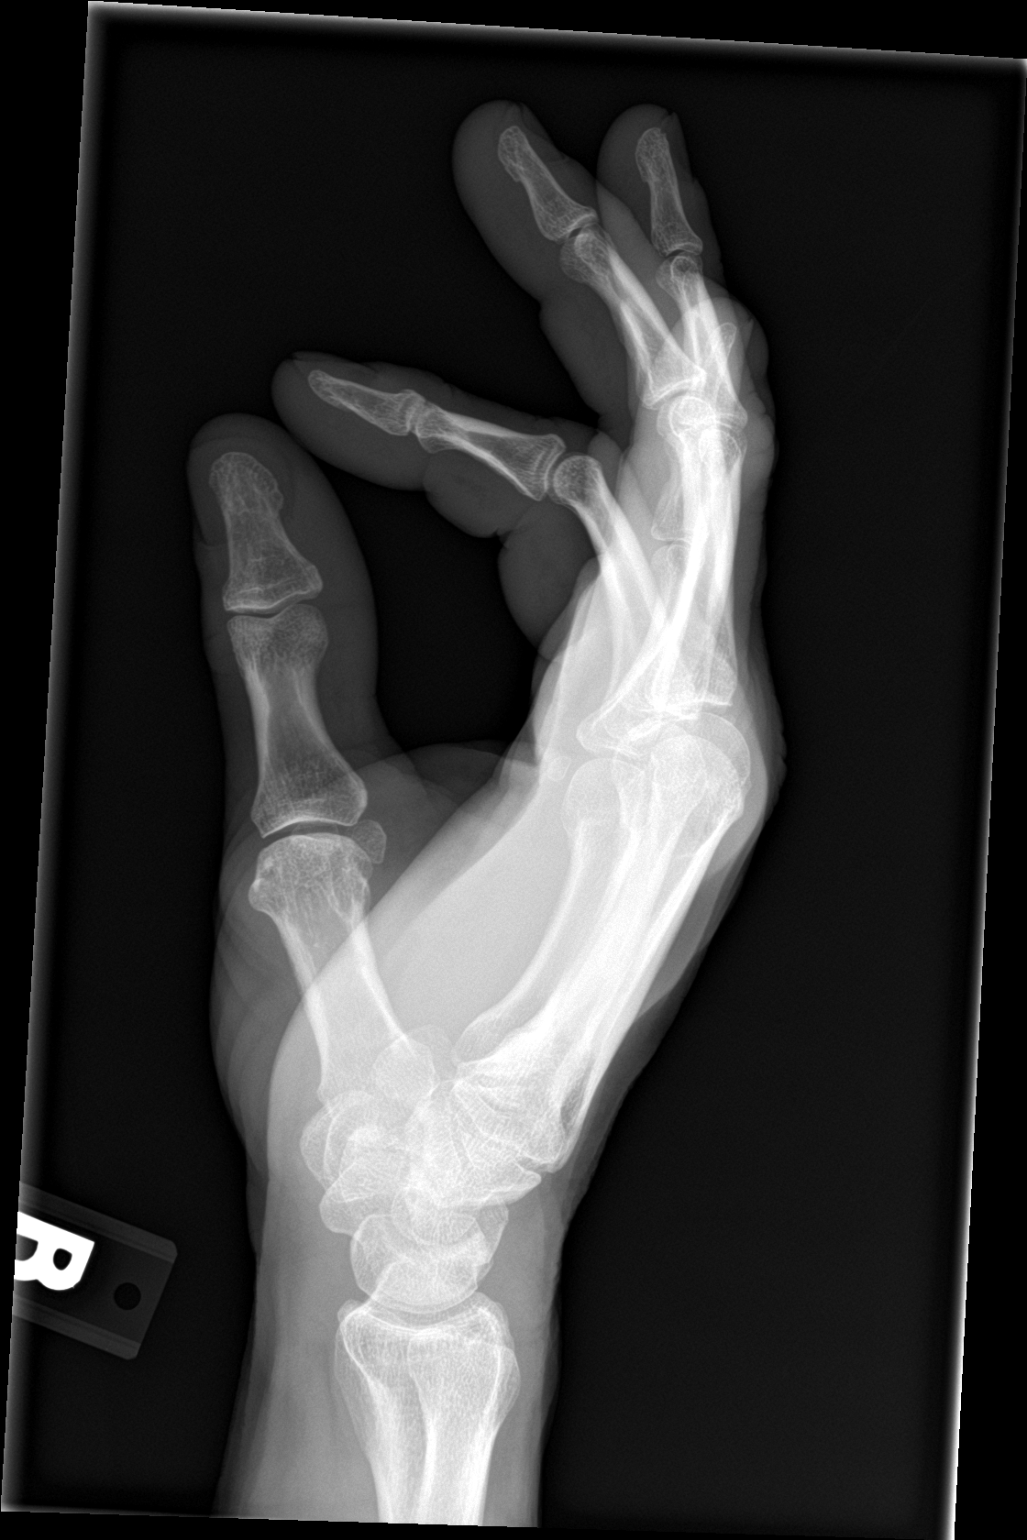

[3 of 3 positions shown; findings below may reference images not displayed]

FINDINGS: Soft tissue swelling about the PIP joint of the right middle finger.
No underlying bony abnormality. No fracture, subluxation or
dislocation. No radiopaque foreign bodies.
IMPRESSION: Soft tissue swelling in the right middle finger. No acute bony
abnormality or radiopaque foreign body.

## 2017-04-17 SURGERY — IRRIGATION AND DEBRIDEMENT EXTREMITY
Anesthesia: General | Site: Finger | Laterality: Right

## 2017-04-17 MED ORDER — LACTATED RINGERS IV SOLN
INTRAVENOUS | Status: DC | PRN
  Administered 2017-04-17: 18:00:00 via INTRAVENOUS

## 2017-04-17 MED ORDER — CEFAZOLIN SODIUM-DEXTROSE 1-4 GM/50ML-% IV SOLN
1.0000 g | Freq: Three times a day (TID) | INTRAVENOUS | Status: DC
  Administered 2017-04-17 – 2017-04-18 (×3): 1 g via INTRAVENOUS
  Filled 2017-04-17 (×4): qty 50

## 2017-04-17 MED ORDER — SODIUM CHLORIDE 0.9 % IV BOLUS (SEPSIS)
1000.0000 mL | Freq: Once | INTRAVENOUS | Status: AC
  Administered 2017-04-17: 1000 mL via INTRAVENOUS

## 2017-04-17 MED ORDER — DIPHENHYDRAMINE HCL 50 MG/ML IJ SOLN: 12.5000 mg | Freq: Four times a day (QID) | INTRAMUSCULAR | Status: DC | PRN

## 2017-04-17 MED ORDER — ONDANSETRON HCL 4 MG/2ML IJ SOLN
4.0000 mg | Freq: Once | INTRAMUSCULAR | Status: AC | PRN
  Administered 2017-04-17: 4 mg via INTRAVENOUS

## 2017-04-17 MED ORDER — MIDAZOLAM HCL 2 MG/2ML IJ SOLN
INTRAMUSCULAR | Status: AC
  Filled 2017-04-17: qty 2

## 2017-04-17 MED ORDER — BUPIVACAINE HCL (PF) 0.25 % IJ SOLN
INTRAMUSCULAR | Status: AC
  Filled 2017-04-17: qty 30

## 2017-04-17 MED ORDER — LIDOCAINE HCL (CARDIAC) 20 MG/ML IV SOLN
INTRAVENOUS | Status: DC | PRN
  Administered 2017-04-17: 60 mg via INTRATRACHEAL

## 2017-04-17 MED ORDER — FENTANYL CITRATE (PF) 250 MCG/5ML IJ SOLN
INTRAMUSCULAR | Status: DC | PRN
  Administered 2017-04-17 (×3): 50 ug via INTRAVENOUS

## 2017-04-17 MED ORDER — SODIUM CHLORIDE 0.9 % IR SOLN
Status: DC | PRN
  Administered 2017-04-17: 1000 mL

## 2017-04-17 MED ORDER — CHLORHEXIDINE GLUCONATE 4 % EX LIQD: 60.0000 mL | Freq: Once | CUTANEOUS | Status: DC

## 2017-04-17 MED ORDER — DEXAMETHASONE SODIUM PHOSPHATE 10 MG/ML IJ SOLN
INTRAMUSCULAR | Status: DC | PRN
  Administered 2017-04-17: 10 mg via INTRAVENOUS

## 2017-04-17 MED ORDER — FENTANYL CITRATE (PF) 100 MCG/2ML IJ SOLN: 25.0000 ug | INTRAMUSCULAR | Status: DC | PRN

## 2017-04-17 MED ORDER — MIDAZOLAM HCL 5 MG/5ML IJ SOLN
INTRAMUSCULAR | Status: DC | PRN
  Administered 2017-04-17: 2 mg via INTRAVENOUS

## 2017-04-17 MED ORDER — DIPHENHYDRAMINE HCL 50 MG/ML IJ SOLN: 25.0000 mg | Freq: Four times a day (QID) | INTRAMUSCULAR | Status: DC | PRN

## 2017-04-17 MED ORDER — PROPOFOL 10 MG/ML IV BOLUS
INTRAVENOUS | Status: DC | PRN
  Administered 2017-04-17: 190 mg via INTRAVENOUS

## 2017-04-17 MED ORDER — ONDANSETRON HCL 4 MG/2ML IJ SOLN
INTRAMUSCULAR | Status: AC
  Filled 2017-04-17: qty 2

## 2017-04-17 MED ORDER — ACETAMINOPHEN 325 MG PO TABS
650.0000 mg | ORAL_TABLET | Freq: Four times a day (QID) | ORAL | Status: DC | PRN
  Administered 2017-04-17 – 2017-04-18 (×3): 650 mg via ORAL
  Filled 2017-04-17 (×3): qty 2

## 2017-04-17 MED ORDER — ONDANSETRON HCL 4 MG PO TABS: 4.0000 mg | ORAL_TABLET | Freq: Four times a day (QID) | ORAL | Status: DC | PRN

## 2017-04-17 MED ORDER — ENOXAPARIN SODIUM 40 MG/0.4ML ~~LOC~~ SOLN
40.0000 mg | SUBCUTANEOUS | Status: DC
  Administered 2017-04-18: 40 mg via SUBCUTANEOUS
  Filled 2017-04-17: qty 0.4

## 2017-04-17 MED ORDER — LACTATED RINGERS IV SOLN
INTRAVENOUS | Status: DC
  Administered 2017-04-17: 16:00:00 via INTRAVENOUS

## 2017-04-17 MED ORDER — CEFAZOLIN SODIUM-DEXTROSE 1-4 GM/50ML-% IV SOLN
1.0000 g | Freq: Three times a day (TID) | INTRAVENOUS | Status: DC
  Filled 2017-04-17 (×2): qty 50

## 2017-04-17 MED ORDER — DIPHENHYDRAMINE HCL 25 MG PO CAPS: 50.0000 mg | ORAL_CAPSULE | Freq: Four times a day (QID) | ORAL | Status: DC | PRN

## 2017-04-17 MED ORDER — ONDANSETRON HCL 4 MG/2ML IJ SOLN: 4.0000 mg | Freq: Four times a day (QID) | INTRAMUSCULAR | Status: DC | PRN

## 2017-04-17 MED ORDER — DIPHENHYDRAMINE HCL 25 MG PO CAPS: 25.0000 mg | ORAL_CAPSULE | Freq: Four times a day (QID) | ORAL | Status: DC | PRN

## 2017-04-17 MED ORDER — KETOROLAC TROMETHAMINE 30 MG/ML IJ SOLN
15.0000 mg | Freq: Once | INTRAMUSCULAR | Status: AC
Start: 2017-04-17 — End: 2017-04-17
  Administered 2017-04-17: 15 mg via INTRAVENOUS
  Filled 2017-04-17: qty 1

## 2017-04-17 MED ORDER — FENTANYL CITRATE (PF) 250 MCG/5ML IJ SOLN
INTRAMUSCULAR | Status: AC
  Filled 2017-04-17: qty 5

## 2017-04-17 MED ORDER — PROPOFOL 10 MG/ML IV BOLUS
INTRAVENOUS | Status: AC
  Filled 2017-04-17: qty 20

## 2017-04-17 MED ORDER — ONDANSETRON HCL 4 MG/2ML IJ SOLN
INTRAMUSCULAR | Status: DC | PRN
  Administered 2017-04-17: 4 mg via INTRAVENOUS

## 2017-04-17 MED ORDER — KETOROLAC TROMETHAMINE 15 MG/ML IJ SOLN
15.0000 mg | Freq: Four times a day (QID) | INTRAMUSCULAR | Status: DC | PRN
  Administered 2017-04-17 – 2017-04-18 (×3): 15 mg via INTRAVENOUS
  Filled 2017-04-17 (×3): qty 1

## 2017-04-17 MED ORDER — VANCOMYCIN HCL 10 G IV SOLR
1250.0000 mg | Freq: Two times a day (BID) | INTRAVENOUS | Status: DC
  Administered 2017-04-18 (×2): 1250 mg via INTRAVENOUS
  Filled 2017-04-17 (×3): qty 1250

## 2017-04-17 MED ORDER — BUPIVACAINE HCL (PF) 0.25 % IJ SOLN
INTRAMUSCULAR | Status: DC | PRN
  Administered 2017-04-17: 10 mL

## 2017-04-17 MED ORDER — ACETAMINOPHEN 650 MG RE SUPP: 650.0000 mg | Freq: Four times a day (QID) | RECTAL | Status: DC | PRN

## 2017-04-17 SURGICAL SUPPLY — 51 items
BANDAGE ACE 3X5.8 VEL STRL LF (GAUZE/BANDAGES/DRESSINGS) IMPLANT
BANDAGE ACE 4X5 VEL STRL LF (GAUZE/BANDAGES/DRESSINGS) IMPLANT
BNDG CMPR 9X4 STRL LF SNTH (GAUZE/BANDAGES/DRESSINGS)
BNDG COHESIVE 1X5 TAN STRL LF (GAUZE/BANDAGES/DRESSINGS) ×3 IMPLANT
BNDG CONFORM 2 STRL LF (GAUZE/BANDAGES/DRESSINGS) IMPLANT
BNDG ESMARK 4X9 LF (GAUZE/BANDAGES/DRESSINGS) IMPLANT
BNDG GAUZE ELAST 4 BULKY (GAUZE/BANDAGES/DRESSINGS) IMPLANT
COVER SURGICAL LIGHT HANDLE (MISCELLANEOUS) ×3 IMPLANT
CUFF TOURNIQUET SINGLE 18IN (TOURNIQUET CUFF) ×3 IMPLANT
DECANTER SPIKE VIAL GLASS SM (MISCELLANEOUS) ×3 IMPLANT
DRAIN PENROSE 1/4X12 LTX STRL (WOUND CARE) IMPLANT
DRAPE SURG 17X23 STRL (DRAPES) ×3 IMPLANT
DRSG PAD ABDOMINAL 8X10 ST (GAUZE/BANDAGES/DRESSINGS) IMPLANT
DURAPREP 26ML APPLICATOR (WOUND CARE) IMPLANT
ELECT REM PT RETURN 9FT ADLT (ELECTROSURGICAL)
ELECTRODE REM PT RTRN 9FT ADLT (ELECTROSURGICAL) IMPLANT
GAUZE PACKING IODOFORM 1/4X5 (PACKING) IMPLANT
GAUZE SPONGE 4X4 12PLY STRL (GAUZE/BANDAGES/DRESSINGS) ×3 IMPLANT
GAUZE XEROFORM 1X8 LF (GAUZE/BANDAGES/DRESSINGS) ×3 IMPLANT
GLOVE SURG SYN 8.0 (GLOVE) ×3 IMPLANT
GOWN STRL REUS W/ TWL LRG LVL3 (GOWN DISPOSABLE) ×1 IMPLANT
GOWN STRL REUS W/ TWL XL LVL3 (GOWN DISPOSABLE) ×1 IMPLANT
GOWN STRL REUS W/TWL LRG LVL3 (GOWN DISPOSABLE) ×3
GOWN STRL REUS W/TWL XL LVL3 (GOWN DISPOSABLE) ×3
HANDPIECE INTERPULSE COAX TIP (DISPOSABLE)
KIT BASIN OR (CUSTOM PROCEDURE TRAY) ×3 IMPLANT
KIT ROOM TURNOVER OR (KITS) ×3 IMPLANT
MANIFOLD NEPTUNE II (INSTRUMENTS) ×3 IMPLANT
NEEDLE HYPO 25GX1X1/2 BEV (NEEDLE) IMPLANT
NEEDLE HYPO 25X1 1.5 SAFETY (NEEDLE) ×3 IMPLANT
NS IRRIG 1000ML POUR BTL (IV SOLUTION) ×3 IMPLANT
PACK ORTHO EXTREMITY (CUSTOM PROCEDURE TRAY) ×3 IMPLANT
PAD ARMBOARD 7.5X6 YLW CONV (MISCELLANEOUS) ×6 IMPLANT
PAD CAST 4YDX4 CTTN HI CHSV (CAST SUPPLIES) IMPLANT
PADDING CAST COTTON 4X4 STRL (CAST SUPPLIES)
SET HNDPC FAN SPRY TIP SCT (DISPOSABLE) IMPLANT
SPONGE LAP 18X18 X RAY DECT (DISPOSABLE) IMPLANT
SUCTION FRAZIER HANDLE 10FR (MISCELLANEOUS) ×2
SUCTION TUBE FRAZIER 10FR DISP (MISCELLANEOUS) ×1 IMPLANT
SUT VICRYL RAPIDE 4/0 PS 2 (SUTURE) IMPLANT
SWAB COLLECTION DEVICE MRSA (MISCELLANEOUS) ×3 IMPLANT
SWAB CULTURE ESWAB REG 1ML (MISCELLANEOUS) ×3 IMPLANT
SYR 20CC LL (SYRINGE) IMPLANT
SYR CONTROL 10ML LL (SYRINGE) ×3 IMPLANT
TOWEL OR 17X24 6PK STRL BLUE (TOWEL DISPOSABLE) IMPLANT
TOWEL OR 17X26 10 PK STRL BLUE (TOWEL DISPOSABLE) ×3 IMPLANT
TUBE CONNECTING 12'X1/4 (SUCTIONS) ×1
TUBE CONNECTING 12X1/4 (SUCTIONS) ×2 IMPLANT
UNDERPAD 30X30 (UNDERPADS AND DIAPERS) ×3 IMPLANT
WATER STERILE IRR 1000ML POUR (IV SOLUTION) ×3 IMPLANT
YANKAUER SUCT BULB TIP NO VENT (SUCTIONS) ×3 IMPLANT

## 2017-04-17 NOTE — Anesthesia Postprocedure Evaluation (Signed)
Anesthesia Post Note  Patient: Leonard Rowe  Procedure(s) Performed: Procedure(s) (LRB): IRRIGATION AND DEBRIDEMENT EXTREMITY RIGHT MIDDLE FINGER (Right)     Patient location during evaluation: PACU Anesthesia Type: General Level of consciousness: awake and alert Pain management: pain level controlled Vital Signs Assessment: post-procedure vital signs reviewed and stable Respiratory status: spontaneous breathing, nonlabored ventilation and respiratory function stable Cardiovascular status: blood pressure returned to baseline and stable Postop Assessment: no signs of nausea or vomiting Anesthetic complications: no    Last Vitals:  Vitals:   04/17/17 1911 04/17/17 1915  BP: 131/80   Pulse: (!) 53 (!) 54  Resp: 18 17  Temp:      Last Pain:  Vitals:   04/17/17 1927  TempSrc:   PainSc: 0-No pain                 Makenley Shimp,W. EDMOND

## 2017-04-17 NOTE — ED Provider Notes (Signed)
[facility] Provider Note   CSN: [id number] Arrival date & time: 04/17/17  4098     History   Chief Complaint Chief Complaint  Patient presents with  . Insect Bite  . Extremity Pain    HPI Leonard Rowe is a 61 y.o. male.  HPI   Leonard Rowe is a 61 y.o. male, with a history of [place] Recluse bite, presenting to the ED with right middle finger pain and swelling beginning 7/7. 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 He did not see what bit him, but had two small bite marks with circle of redness around them. He began to have pain and swelling the next day, was seen in the ED later in the day on 7/7, and was prescribed keflex and bactrim with hand surgery follow up. Patient states the pain and swelling has significantly worsened. Current pain is 8 out of 10, throbbing/burning, radiating proximally into the hand. He now endorses chills, nausea, and pain extending into the hand with burning and hypersensitivity. He states he has been bitten by a brown recluse spider in the past and this presentation was different than his presentation today, however, this bite was on the right forearm. He states he has been taking the antibiotics as instructed.   Patient denies vomiting/diarrhea, abdominal pain or cramping, chest pain, shortness of breath, rashes, global myalgias/arthralgias, or any other complaints.  Patient would like to avoid narcotic analgesics due to severe nausea experienced with both codeine and morphine. Right hand dominant. Last PO was around 8AM this morning. Patient had water and a [employer].     History reviewed. No pertinent past medical history.  Patient Active Problem List   Diagnosis Date Noted  . Insect bite 04/17/2017    Past Surgical History:  Procedure Laterality Date  . CHOLECYSTECTOMY    . KNEE SURGERY    . ROTATOR CUFF REPAIR         Home Medications    Prior to  Admission medications   Medication Sig Start Date End Date Taking? Authorizing Provider  Aspirin-Salicylamide-Caffeine (BC HEADACHE POWDER PO) Take 1 packet by mouth daily as needed (pain).   Yes [provider]  cephALEXin (KEFLEX) 500 MG capsule Take 1 capsule (500 mg total) by mouth 4 (four) times daily. 04/15/17  Yes Neese, Hope M, NP  cetirizine (ZYRTEC) 10 MG chewable tablet Chew 10 mg by mouth 2 (two) times a week.    Yes [provider]  sulfamethoxazole-trimethoprim (BACTRIM DS,SEPTRA DS) 800-160 MG tablet Take 1 tablet by mouth 2 (two) times daily. 04/15/17 04/25/17 Yes Neese, Hope M, NP  acyclovir (ZOVIRAX) 800 MG tablet Take 1 tablet (800 mg total) by mouth 5 (five) times daily. Patient not taking: Reported on 04/17/2017 06/01/15   Ozella Rocks, MD  naproxen (NAPROSYN) 500 MG tablet Take 1 tablet (500 mg total) by mouth 2 (two) times daily. Patient not taking: Reported on 04/17/2017 04/15/17   Janne Napoleon, NP  promethazine (PHENERGAN) 25 MG tablet Take 1 tablet (25 mg total) by mouth every 6 (six) hours as needed for nausea. Patient not taking: Reported on 04/17/2017 07/17/14   Benjiman Core, MD    Family History Family History  Problem Relation Age of Onset  . Heart disease Father     Social History Social History  Substance Use Topics  . Smoking status: Former Smoker    Packs/day: 0.25  Types: Cigarettes  . Smokeless tobacco: Never Used  . Alcohol use No     Allergies   Codeine   Review of Systems Review of Systems  Constitutional: Positive for chills.  Respiratory: Negative for shortness of breath.   Cardiovascular: Negative for chest pain.  Gastrointestinal: Positive for nausea. Negative for abdominal pain and vomiting.  Musculoskeletal: Positive for arthralgias and joint swelling.  Neurological: Negative for dizziness, weakness, light-headedness and headaches.       Tingling and hypersensitivity to right hand.  All other systems reviewed and  are negative.    Physical Exam Updated Vital Signs BP (!) 165/77 (BP Location: Left Arm)   Pulse 72   Temp (!) 97.4 F (36.3 C) (Oral)   Resp 14   SpO2 100%   Physical Exam  Constitutional: He appears well-developed and well-nourished. No distress.  HENT:  Head: Normocephalic and atraumatic.  Eyes: Conjunctivae are normal.  Neck: Neck supple.  Cardiovascular: Normal rate, regular rhythm, normal heart sounds and intact distal pulses.   Pulmonary/Chest: Effort normal and breath sounds normal. No respiratory distress.  Abdominal: Soft. There is no tenderness. There is no guarding.  Musculoskeletal: He exhibits edema and tenderness.  Swelling and erythema to the dorsal right middle finger. Exquisite tenderness to the same. Tenderness extends into the dorsal right hand. Patient also notes tingling and hypersensitivity to the dorsal hand. Range of motion limited by swelling and pain.  Lymphadenopathy:    He has no cervical adenopathy.  Neurological: He is alert.  Patient has no noted sensory deficits, but hypersensitivity to light touch. Strength in the other fingers on the right hand 5 out of 5.  Skin: Skin is warm and dry. Capillary refill takes less than 2 seconds. He is not diaphoretic.  Swelling and blistering to the dorsal side of the right middle finger. Dark blue discoloration under the blisters. Erythema tracking proximally into the dorsal hand.  Psychiatric: He has a normal mood and affect. His behavior is normal.  Nursing note and vitals reviewed.                Picture from 04/15/17:   ED Treatments / Results  Labs (all labs ordered are listed, but only abnormal results are displayed) Labs Reviewed  COMPREHENSIVE METABOLIC PANEL - Abnormal; Notable for the following:       Result Value   Glucose, Bld 120 (*)    ALT 15 (*)    All other components within normal limits  CBC WITH DIFFERENTIAL/PLATELET - Abnormal; Notable for the following:    WBC 10.7 (*)      All other components within normal limits  I-STAT CG4 LACTIC ACID, ED - Abnormal; Notable for the following:    Lactic Acid, Venous 2.01 (*)    All other components within normal limits  CULTURE, BLOOD (ROUTINE X 2)  CULTURE, BLOOD (ROUTINE X 2)    EKG  EKG Interpretation None       Radiology Dg Hand Complete Right  Result Date: 04/17/2017 CLINICAL DATA:  Middle finger, hand swelling, spider bite EXAM: RIGHT HAND - COMPLETE 3+ VIEW COMPARISON:  04/15/2017 FINDINGS: Soft tissue swelling about the PIP joint of the right middle finger. No underlying bony abnormality. No fracture, subluxation or dislocation. No radiopaque foreign bodies. IMPRESSION: Soft tissue swelling in the right middle finger. No acute bony abnormality or radiopaque foreign body. Electronically Signed   By: Charlett Nose M.D.   On: 04/17/2017 10:25   Dg Finger Middle Right  Result Date: 04/15/2017 CLINICAL DATA:  Swelling.  Possible bite.  No trauma. EXAM: RIGHT MIDDLE FINGER 2+V COMPARISON:  11/08/2015 FINDINGS: Soft tissue swelling centered about the proximal interphalangeal joint of the middle finger. No radiopaque foreign object. No acute fracture or dislocation. No focal osseous lesion. IMPRESSION: Soft tissue swelling about the proximal interphalangeal joint. No acute findings. Electronically Signed   By: Jeronimo GreavesKyle  Talbot M.D.   On: 04/15/2017 17:14    Procedures Procedures (including critical care time)  Medications Ordered in ED Medications  sodium chloride 0.9 % bolus 1,000 mL (0 mLs Intravenous Stopped 04/17/17 1040)  ketorolac (TORADOL) 30 MG/ML injection 15 mg (15 mg Intravenous Given 04/17/17 1007)     Initial Impression / Assessment and Plan / ED Course  I have reviewed the triage vital signs and the nursing notes.  Pertinent labs & imaging results that were available during my care of the patient were reviewed by me and considered in my medical decision making (see chart for details).  Clinical Course  as of Apr 17 1205  Mon Apr 17, 2017  1118 Spoke with Orlie PollenLynne, Dr. Ronie SpiesWeingold's nurse, who took down the patient's story and PE findings. She will speak with Dr. Mina MarbleWeingold and call me back.  [SJ]  1120 Patient updated on lab findings and imaging. States his pain is now 4/10 following the Toradol. Declined further pain management at this time.  [SJ]  1130 Spoke with Orlie PollenLynne again. States Dr. Mina MarbleWeingold will post this patient for surgery after 5pm today. NPO. No ABX. Toradol is fine for pain control. Admit via hospitalist.   [SJ]  1152 Spoke with Orest Dikeshris Rice, IM teaching service, who agreed to come assess the patient and admit him.  [SJ]    Clinical Course User Index [SJ] Logun Colavito C, PA-C    Patient presents with swelling and pain to right middle finger. Suspect bite with possible necrotic progression. Patient to go to OR this evening for washout and culture. Admission via medical service.  Findings and plan of care discussed with Raeford RazorStephen Kohut, MD. Dr. Juleen ChinaKohut personally evaluated and examined this patient.  When the patient was seen in the ED on 7/7, he was instructed to follow up with hand surgery on an outpatient basis. As far as I can tell, the hand surgeon was not contacted about the patient by the provider. This is only important because this went into my decision to contact the hand surgeon currently on call.  Vitals:   04/17/17 0725 04/17/17 1000 04/17/17 1030 04/17/17 1100  BP: (!) 165/77 139/82 137/86 (!) 146/84  Pulse: 72 72 63 63  Resp: 14     Temp: (!) 97.4 F (36.3 C)     TempSrc: Oral     SpO2: 100% 100% 100% 100%     Final Clinical Impressions(s) / ED Diagnoses   Final diagnoses:  Finger swelling    New Prescriptions New Prescriptions   No medications on file     Concepcion LivingJoy, [place] C, PA-C 04/17/17 1207    Anselm PancoastJoy, Almus Woodham C, PA-C 04/17/17 1207    Raeford RazorKohut, Stephen, MD 04/18/17 0930

## 2017-04-17 NOTE — Anesthesia Procedure Notes (Signed)
Procedure Name: LMA Insertion Date/Time: 04/17/2017 6:00 PM Performed by: Izola PriceOCKFIELD JR, Deiona Hooper WALTON Pre-anesthesia Checklist: Patient identified, Emergency Drugs available, Suction available, Patient being monitored and Timeout performed Patient Re-evaluated:Patient Re-evaluated prior to inductionOxygen Delivery Method: Circle system utilized Preoxygenation: Pre-oxygenation with 100% oxygen Intubation Type: IV induction Ventilation: Mask ventilation without difficulty LMA: LMA inserted LMA Size: 4.0 Number of attempts: 1 Dental Injury: Teeth and Oropharynx as per pre-operative assessment

## 2017-04-17 NOTE — Transfer of Care (Signed)
Immediate Anesthesia Transfer of Care Note  Patient: Leonard Rowe  Procedure(s) Performed: Procedure(s): IRRIGATION AND DEBRIDEMENT EXTREMITY RIGHT MIDDLE FINGER (Right)  Patient Location: PACU  Anesthesia Type:General  Level of Consciousness: awake, alert  and oriented  Airway & Oxygen Therapy: Patient Spontanous Breathing and Patient connected to face mask oxygen  Post-op Assessment: Report given to RN and Post -op Vital signs reviewed and stable  Post vital signs: Reviewed and stable  Last Vitals:  Vitals:   04/17/17 1537 04/17/17 1827  BP: 139/81   Pulse: (!) 58   Resp: 18   Temp: 37.6 C (!) (P) 36.2 C    Last Pain:  Vitals:   04/17/17 1537  TempSrc: Axillary  PainSc:          Complications: No apparent anesthesia complications

## 2017-04-17 NOTE — Op Note (Signed)
Please see dictated operative report [phone]

## 2017-04-17 NOTE — Consult Note (Signed)
Reason for Consult: Right long finger infection Referring Physician: KELDAN Rowe is an 61 y.o. male.  HPI: Patient's a very pleasant 61 year old right-hand-dominant male who presented to the emergency department on July 7 for pain and swelling over the right long finger proximal interphalangeal joint. Patient was placed on oral antibiotics and return today with worsening of his pain and swelling.  History reviewed. No pertinent past medical history.  Past Surgical History:  Procedure Laterality Date  . APPENDECTOMY    . CHOLECYSTECTOMY    . KNEE SURGERY    . ROTATOR CUFF REPAIR      Family History  Problem Relation Age of Onset  . Heart disease Father     Social History:  reports that he has quit smoking. His smoking use included Cigarettes. He smoked 0.25 packs per day. He has never used smokeless tobacco. He reports that he does not drink alcohol or use drugs.  Allergies:  Allergies  Allergen Reactions  . Codeine Hives and Itching    Medications:  Prior to Admission:  Prescriptions Prior to Admission  Medication Sig Dispense Refill Last Dose  . Aspirin-Salicylamide-Caffeine (BC HEADACHE POWDER PO) Take 1 packet by mouth daily as needed (pain).   Past Month at Unknown time  . cephALEXin (KEFLEX) 500 MG capsule Take 1 capsule (500 mg total) by mouth 4 (four) times daily. 40 capsule 0 04/17/2017 at Unknown time  . cetirizine (ZYRTEC) 10 MG chewable tablet Chew 10 mg by mouth 2 (two) times a week.    Past Week at Unknown time  . sulfamethoxazole-trimethoprim (BACTRIM DS,SEPTRA DS) 800-160 MG tablet Take 1 tablet by mouth 2 (two) times daily. 20 tablet 0 04/17/2017 at Unknown time    Results for orders placed or performed during the hospital encounter of 04/17/17 (from the past 48 hour(s))  Comprehensive metabolic panel     Status: Abnormal   Collection Time: 04/17/17  9:42 AM  Result Value Ref Range   Sodium 141 135 - 145 mmol/L   Potassium 4.4 3.5 - 5.1 mmol/L   Chloride 106 101 - 111 mmol/L   CO2 27 22 - 32 mmol/L   Glucose, Bld 120 (H) 65 - 99 mg/dL   BUN 10 6 - 20 mg/dL   Creatinine, Ser 1.05 0.61 - 1.24 mg/dL   Calcium 9.3 8.9 - 10.3 mg/dL   Total Protein 7.5 6.5 - 8.1 g/dL   Albumin 4.0 3.5 - 5.0 g/dL   AST 21 15 - 41 U/L   ALT 15 (L) 17 - 63 U/L   Alkaline Phosphatase 60 38 - 126 U/L   Total Bilirubin 0.8 0.3 - 1.2 mg/dL   GFR calc non Af Amer >60 >60 mL/min   GFR calc Af Amer >60 >60 mL/min    Comment: (NOTE) The eGFR has been calculated using the CKD EPI equation. This calculation has not been validated in all clinical situations. eGFR's persistently <60 mL/min signify possible Chronic Kidney Disease.    Anion gap 8 5 - 15  CBC with Differential     Status: Abnormal   Collection Time: 04/17/17  9:42 AM  Result Value Ref Range   WBC 10.7 (H) 4.0 - 10.5 K/uL   RBC 4.60 4.22 - 5.81 MIL/uL   Hemoglobin 14.0 13.0 - 17.0 g/dL   HCT 42.6 39.0 - 52.0 %   MCV 92.6 78.0 - 100.0 fL   MCH 30.4 26.0 - 34.0 pg   MCHC 32.9 30.0 - 36.0 g/dL  RDW 13.6 11.5 - 15.5 %   Platelets 328 150 - 400 K/uL   Neutrophils Relative % 68 %   Neutro Abs 7.3 1.7 - 7.7 K/uL   Lymphocytes Relative 24 %   Lymphs Abs 2.6 0.7 - 4.0 K/uL   Monocytes Relative 6 %   Monocytes Absolute 0.7 0.1 - 1.0 K/uL   Eosinophils Relative 1 %   Eosinophils Absolute 0.1 0.0 - 0.7 K/uL   Basophils Relative 1 %   Basophils Absolute 0.1 0.0 - 0.1 K/uL  I-Stat CG4 Lactic Acid, ED     Status: Abnormal   Collection Time: 04/17/17  9:57 AM  Result Value Ref Range   Lactic Acid, Venous 2.01 (HH) 0.5 - 1.9 mmol/L   Comment NOTIFIED PHYSICIAN     Dg Hand Complete Right  Result Date: 04/17/2017 CLINICAL DATA:  Middle finger, hand swelling, spider bite EXAM: RIGHT HAND - COMPLETE 3+ VIEW COMPARISON:  04/15/2017 FINDINGS: Soft tissue swelling about the PIP joint of the right middle finger. No underlying bony abnormality. No fracture, subluxation or dislocation. No radiopaque foreign  bodies. IMPRESSION: Soft tissue swelling in the right middle finger. No acute bony abnormality or radiopaque foreign body. Electronically Signed   By: Rolm Baptise M.D.   On: 04/17/2017 10:25    Review of Systems  All other systems reviewed and are negative.  Blood pressure 139/81, pulse (!) 58, temperature 99.6 F (37.6 C), temperature source Axillary, resp. rate 18, SpO2 99 %. Physical Exam  Constitutional: He is oriented to person, place, and time. He appears well-developed and well-nourished.  HENT:  Head: Normocephalic and atraumatic.  Neck: Normal range of motion.  Cardiovascular: Normal rate.   Respiratory: Effort normal.  Musculoskeletal:       Right hand: He exhibits tenderness, deformity and swelling.   Right long finger proximal interphalangeal joint pain and swelling with abscesses dorsally.  Neurological: He is alert and oriented to person, place, and time.  Skin: Skin is warm. There is erythema.  Psychiatric: He has a normal mood and affect. His behavior is normal. Judgment and thought content normal.    Assessment/Plan: As above. Recommend incision and drainage of right long finger with cultures and overnight admission for intravenous antibiotics.  Leonard Rowe 04/17/2017, 5:38 PM

## 2017-04-17 NOTE — Anesthesia Preprocedure Evaluation (Signed)
Anesthesia Evaluation  Patient identified by MRN, date of birth, ID band Patient awake    Reviewed: Allergy & Precautions, NPO status , Patient's Chart, lab work & pertinent test results  Airway Mallampati: II  TM Distance: >3 FB Neck ROM: Full    Dental  (+) Teeth Intact, Dental Advisory Given   Pulmonary former smoker,    breath sounds clear to auscultation       Cardiovascular  Rhythm:Regular Rate:Normal     Neuro/Psych    GI/Hepatic   Endo/Other    Renal/GU      Musculoskeletal   Abdominal   Peds  Hematology   Anesthesia Other Findings   Reproductive/Obstetrics                             Anesthesia Physical Anesthesia Plan  ASA: II  Anesthesia Plan: General   Post-op Pain Management:    Induction: Intravenous  PONV Risk Score and Plan: Ondansetron, Propofol and Midazolam  Airway Management Planned: LMA  Additional Equipment:   Intra-op Plan:   Post-operative Plan:   Informed Consent: I have reviewed the patients History and Physical, chart, labs and discussed the procedure including the risks, benefits and alternatives for the proposed anesthesia with the patient or authorized representative who has indicated his/her understanding and acceptance.   Dental advisory given  Plan Discussed with: Anesthesiologist and CRNA  Anesthesia Plan Comments:         Anesthesia Quick Evaluation

## 2017-04-17 NOTE — ED Triage Notes (Signed)
Pt. Stated, I was here on Saturday for a bite on my finger and given 2 antibiotic . Rt. middle finger.

## 2017-04-17 NOTE — Progress Notes (Signed)
Patient underwent incision and drainage right long finger dorsal subcutaneous abscess. Patient's wound was cultured with Gram stain. Patient's wound was packed open. Patient had mostly superficial dermal lysis and no deep infection. Would recommend admission for intravenous antibiotics with DC tomorrow on oral antibiotics. We'll need to see the patient in my office this Thursday. Patient had soft tissue surgery only and should not require any narcotic pain medications.

## 2017-04-17 NOTE — ED Provider Notes (Signed)
Medical screening examination/treatment/procedure(s) were conducted as a shared visit with non-physician practitioner(s) and myself.  I personally evaluated the patient during the encounter.   EKG Interpretation None      61 year old male presenting for reevaluation of his right middle finger. On exam he has a large hemorrhagic blister primarily over the dorsal aspect of the mid finger. Does appear to contain for purulent material as well. Has some erythema and mild streaking past CMP joint. NVI distally. He has been on cephalexin and Bactrim but symptoms have continued to progress. This may potentially resulted after an insect insect bite/sting. Hand surgery was consulted. To OR this evening for debridement.   Raeford RazorKohut, Ming Mcmannis, MD 04/17/17 1143

## 2017-04-17 NOTE — ED Notes (Signed)
Patient transported to x-ray. ?

## 2017-04-17 NOTE — H&P (Signed)
Date: 04/17/2017               Patient Name:  Leonard Rowe MRN: [id number]  DOB: 01/26/1956 Age / Sex: 61 y.o., male   PCP: Patient, No Pcp Per         Medical Service: Internal Medicine Teaching Service         Attending Physician: Dr. Rogelia BogaButcher, Austin MilesElizabeth A, MD    First Contact: Dr. Alinda MoneyMelvin Pager: [phone]  Second Contact: Dr. Dimple Caseyice Pager: [phone]       After Hours (After 5p/  First Contact Pager: [phone]  weekends / holidays): Second Contact Pager: [phone]   Chief Complaint: Increasing pain and swelling of right third digit  History of Present Illness: Leonard Rowe is a 61 year old male with a history brown recluse bite (approximately 2 years ago) presented to the ED with increasing pain and swelling in his right middle finger at the site of a suspected insect/spider bite. 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 The following morning the site had become painful and had a darker discoloration. He visited the ED on 7/7 where he was prescribed keflex and bactrim (which he has been taking) and told to follow up with a hand surgeon within a week. The swelling has spread and his pain has progressively worsened and is a constant, burning/itching, 8/10 today. He endorses Chills for the last couple of nights and difficulty sleeping due to pain. Images from both ED visit available to view in the media tab in the EMR.  In the ED his WBC was mildly elevated at 10.7 and his lactic acid was mildly elevated at 2.01. Right hand xray revealed: Soft tissue swelling in the right middle finger. No acute bony abnormality or radiopaque foreign body. He received toradol for pain (he asked to avoid narcotic analgesics due to nausea he has experienced with both codeine and morphine.) He has been seen by surgery and is scheduled for hand surgery today. He will be admitted for  surgery and further workup.  Meds:  Current Meds  Medication Sig  . Aspirin-Salicylamide-Caffeine (BC HEADACHE POWDER PO) Take 1 packet by mouth daily as needed (pain).  . cephALEXin (KEFLEX) 500 MG capsule Take 1 capsule (500 mg total) by mouth 4 (four) times daily.  . cetirizine (ZYRTEC) 10 MG chewable tablet Chew 10 mg by mouth 2 (two) times a week.   . sulfamethoxazole-trimethoprim (BACTRIM DS,SEPTRA DS) 800-160 MG tablet Take 1 tablet by mouth 2 (two) times daily.     Allergies: Allergies as of 04/17/2017 - Review Complete 04/17/2017  Allergen Reaction Noted  . Codeine Hives and Itching 12/01/2011   History reviewed. No pertinent past medical history.  Family History: Family History  Problem Relation Age of Onset  . Heart disease Father     Social History: Tobacco: Former smoker (quit 6 years ago; 2.5-3 pack year history) Alcohol: 1 beer / week Illicit Drug Use: Denies Lives with wife and adult son Works in [employer]   Review of Systems: A complete ROS was negative except as per HPI.  Physical Exam: Blood pressure (!) 146/84, pulse 63, temperature (!) 97.4 F (36.3 C), temperature source Oral, resp. rate 14, SpO2 100 %. Physical Exam  Constitutional: He is oriented to person, place, and time. He appears well-developed and well-nourished.  HENT:  Head:  Atraumatic.  Residual scaring and papules  Eyes: EOM are normal. No scleral icterus.  Cardiovascular: Normal rate and regular rhythm.  Exam reveals no gallop and no friction rub.   No murmur heard. Pulmonary/Chest: Effort normal and breath sounds normal. No respiratory distress.  Abdominal: Soft. Bowel sounds are normal. He exhibits no distension. There is no tenderness.  Musculoskeletal:  5cm x 3.5cm area of swelling centered around the L Third PIP joint. Area is very tender. Tenderness to palpation of 2-4 MIPs.  Neurological: He is alert and oriented to person, place, and time.  Skin: Skin is dry.   R Hand  Xray: IMPRESSION: Soft tissue swelling in the right middle finger. No acute bony abnormality or radiopaque foreign body.   Assessment & Plan by Problem:  Insect bite vs Spider bite: Presumed bite based on history and initial presetation with puncture sites. Severe local presentation with some systemic response in the form of itching.  - Surgery Consult: Surgery planned for today - Benadryl for itching - Tylenol  And Toradol for pain. - No Antibiotic prior to surgery, Intraoperative culture planned  F/E/N: NPO for surgery  Dispo: Admit patient to Observation with expected length of stay less than 2 midnights.  Signed: Beola Cord, DO IM PGY-1 Pager: [phone]

## 2017-04-18 ENCOUNTER — Encounter (HOSPITAL_COMMUNITY): Payer: Self-pay | Admitting: Orthopedic Surgery

## 2017-04-18 LAB — HIV ANTIBODY (ROUTINE TESTING W REFLEX): HIV Screen 4th Generation wRfx: NONREACTIVE

## 2017-04-18 LAB — CBC
HCT: 38.6 % — ABNORMAL LOW (ref 39.0–52.0)
Hemoglobin: 12.9 g/dL — ABNORMAL LOW (ref 13.0–17.0)
MCH: 30.5 pg (ref 26.0–34.0)
MCHC: 33.4 g/dL (ref 30.0–36.0)
MCV: 91.3 fL (ref 78.0–100.0)
Platelets: 299 10*3/uL (ref 150–400)
RBC: 4.23 MIL/uL (ref 4.22–5.81)
RDW: 13.1 % (ref 11.5–15.5)
WBC: 9.4 10*3/uL (ref 4.0–10.5)

## 2017-04-18 MED ORDER — CHLORHEXIDINE GLUCONATE 0.12 % MT SOLN
OROMUCOSAL | Status: AC
  Filled 2017-04-18: qty 15

## 2017-04-18 NOTE — Discharge Summary (Signed)
Name: Leonard Rowe MRN: [id number] DOB: 01/07/1956 61 y.o. PCP: Patient, No Pcp Per  Date of Admission: 04/17/2017  9:00 AM Date of Discharge: 04/18/2017 Attending Physician: Burns Spain, MD  Discharge Diagnosis:  Principal Problem:   Insect bite Active Problems:   Finger swelling  Discharge Medications: Allergies as of 04/18/2017      Reactions   Codeine Hives, Itching      Medication List    TAKE these medications   BC HEADACHE POWDER PO Take 1 packet by mouth daily as needed (pain).   cephALEXin 500 MG capsule Commonly known as:  KEFLEX Take 1 capsule (500 mg total) by mouth 4 (four) times daily.   cetirizine 10 MG chewable tablet Commonly known as:  ZYRTEC Chew 10 mg by mouth 2 (two) times a week.   sulfamethoxazole-trimethoprim 800-160 MG tablet Commonly known as:  BACTRIM DS,SEPTRA DS Take 1 tablet by mouth 2 (two) times daily.       Disposition and follow-up:   Leonard Rowe was discharged from [facility] in Good condition.  At the hospital follow up visit please address:  1.  Right Third Finger Wound (Presumed Bite): Please evaluate for wound healing progress, pain, and function.  2.  Labs / imaging needed at time of follow-up: None  3.  Pending labs/ test needing follow-up: Intraoperative Cultures  Follow-up Appointments: Follow-up Information    Dairl Ponder, MD Follow up on 04/20/2017.   Specialty:  Orthopedic Surgery Why:  For wound re-check Contact information: [address] [place] [address] [phone]        [facility] Follow up.   Contact information: [address] [place] [address] [phone]          Hospital Course by problem list:   1. Finger Swelling (Presumed Insect/Spider bite): Leonard Rowe presented to the ED with increasing pain and swelling in his right middle finger at the site of a suspected insect/spider bite at  the Left 3rd PIP. He had initially presented to the ED on 7/7 where he was prescribed keflex and bactrim and instructed to follow up with a hand surgeon within a week. The swelling spread and his pain progressively worsened so he returned to the ED for reevaluation. (Images from both encounter are available in the EMR media tab.)  On presentation on 04/17/17, his WBC was mildly elevated at 10.7 and his lactic acid was mildly elevated at 2.01. Right hand xray revealed: Soft tissue swelling in the right middle finger. No acute bony abnormality or radiopaque foreign body. He received pain medication and benadryl and was scheduled for surgery that evening. The surgery went well and showed only soft tissue involvement. He was started on empiric Vancomycin and Ancef as an inpatient. His WBC came down to 9.4  He is being discharge on previously prescribed bactrim and keflex for empiric coverage of potential infection. He is scheduled to follow up with surgery on 04/20/17.  Pertinent Labs, Studies, and Procedures:  CBC Latest Ref Rng & Units 04/18/2017 04/17/2017 07/21/2014  WBC 4.0 - 10.5 K/uL 9.4 10.7(H) 6.4  Hemoglobin 13.0 - 17.0 g/dL 12.9(L) 14.0 14.6  Hematocrit 39.0 - 52.0 % 38.6(L) 42.6 42.3  Platelets 150 - 400 K/uL 299 328 324   Xray (Right Hand Complete): IMPRESSION: Soft tissue swelling in the right middle finger. No acute bony abnormality or radiopaque foreign body.  Wound Culture: Specimen Description WOUND   Special Requests RIGHT  MIDDLE FINGER   Gram Stain MODERATE WBC PRESENT, PREDOMINANTLY PMN  RARE GRAM POSITIVE COCCI  RARE GRAM NEGATIVE RODS      Culture FEW STAPHYLOCOCCUS AUREUS  SUSCEPTIBILITIES TO FOLLOW      Report Status PENDING     Discharge Instructions: Discharge Instructions    Call MD for:  severe uncontrolled pain    Complete by:  As directed    Call MD for:  temperature >100.4    Complete by:  As directed    Diet - low sodium heart healthy    Complete by:  As  directed    Discharge instructions    Complete by:  As directed    Please follow up at your surgery appointment on Thursday to have you finger reevaluated.  Restart the antibiotics you were taking before coming to the hospital (Bactrim and Keflex), they are still appropriate.  Take Ibuprofen (Advil) for pain as the packaging directs. Take benadryl as needed for itching.   Increase activity slowly    Complete by:  As directed       Signed: Beola CordAlexander Melvin, DO IM PGY-1 Pager: [phone]

## 2017-04-18 NOTE — Progress Notes (Signed)
NURSING PROGRESS NOTE  Berline Lopesverette W Pinela [id number] Discharge Data: 04/18/2017 2:50 PM Attending Provider: Burns SpainButcher, Elizabeth A, MD EXB:[id number]:Patient, No Pcp Per     Berline LopesEverette W Aro to be D/C'd Home per MD order.  Discussed with the patient the After Visit Summary and all questions fully answered. All IV's discontinued with no bleeding noted. All belongings returned to patient for patient to take home.   Last Vital Signs:  Blood pressure 134/82, pulse 66, temperature 98.8 F (37.1 C), resp. rate 20, SpO2 100 %.  Discharge Medication List Allergies as of 04/18/2017      Reactions   Codeine Hives, Itching      Medication List    TAKE these medications   BC HEADACHE POWDER PO Take 1 packet by mouth daily as needed (pain).   cephALEXin 500 MG capsule Commonly known as:  KEFLEX Take 1 capsule (500 mg total) by mouth 4 (four) times daily.   cetirizine 10 MG chewable tablet Commonly known as:  ZYRTEC Chew 10 mg by mouth 2 (two) times a week.   sulfamethoxazole-trimethoprim 800-160 MG tablet Commonly known as:  BACTRIM DS,SEPTRA DS Take 1 tablet by mouth 2 (two) times daily.        Roma KayserStephanie Hala Narula, RN

## 2017-04-18 NOTE — Progress Notes (Signed)
Pharmacy Antibiotic Note  Leonard Rowe is a 61 y.o. male admitted on 04/17/2017 with R finger pain/swelling s/p I & D.  Pharmacy has been consulted for Vancomycin  dosing.  Plan: Vancomycin 1250 mg IV q12h as ordered    Temp (24hrs), Avg:97.9 F (36.6 C), Min:97.2 F (36.2 C), Max:99.6 F (37.6 C)   Recent Labs Lab 04/17/17 0942 04/17/17 0957  WBC 10.7*  --   CREATININE 1.05  --   LATICACIDVEN  --  2.01*    Estimated Creatinine Clearance: 81.6 mL/min (by C-G formula based on SCr of 1.05 mg/dL).    Allergies  Allergen Reactions  . Codeine Hives and Itching    Leonard Rowe, Leonard Rowe 04/18/2017 1:02 AM

## 2017-04-18 NOTE — Op Note (Signed)
NAME:  Leonard Rowe, Leonard Rowe              ACCOUNT NO.:  [id number]  MEDICAL RECORD NO.:  [id number]  LOCATION:                                 FACILITY:  PHYSICIAN:  Artist PaisMatthew A. Mina MarbleWeingold, M.D.   DATE OF BIRTH:  DATE OF PROCEDURE:  04/17/2017 DATE OF DISCHARGE:                              OPERATIVE REPORT   PREOPERATIVE DIAGNOSIS:  Right long finger infection.  POSTOPERATIVE DIAGNOSIS:  Right long finger infection.  PROCEDURE:  Incision and drainage, right long finger.  SURGEON:  Artist PaisMatthew A. Mina MarbleWeingold, M.D.  ASSISTANT:  None.  ANESTHESIA:  General.  COMPLICATION:  None.  DRAINS:  None.  CULTURES:  Sent.  DESCRIPTION OF PROCEDURE:  The patient was taken to the operating suite. After the induction of adequate general anesthetic, right upper extremity was prepped and draped in usual sterile fashion.  An Esmarch was used to exsanguinate the limb.  Tourniquet was inflated to 250 mmHg. At this point in time, incision was made dorsally of the right long finger at PIP joint.  Appearance was encountered over the extensor sheath between the PIP joint and DIP joint.  This was cultured for aerobic, anaerobic, and Gram stain.  We irrigated with a liter of normal saline and then, we were able to sharply debride the overlying skin down to the dermis of approximately 2 x 2 cm with a sharp scissor.  We then took a 1 x 9 Xeroform and wrapped the wound.  We then soaked a 4x4s in 0.25% plain Marcaine and placed it over the wound followed by a dry 4 x 4 and Coban wrap.  The patient tolerated the procedure well, went to the recovery room in stable fashion.     Artist PaisMatthew A. Mina MarbleWeingold, M.D.   ______________________________ Artist PaisMatthew A. Mina MarbleWeingold, M.D.    MAW/MEDQ  D:  04/17/2017  T:  04/18/2017  Job:  [id number]

## 2017-04-18 NOTE — Progress Notes (Signed)
  Date: 04/18/2017  Patient name: Leonard Rowe  Medical record number: [id number]  Date of birth: 02/13/1956   I have seen and evaluated Leonard LopesEverette W Denker and discussed their care with the Residency Team. Mr. [name] is a 61 year old man who presents for worsening right lower finger swelling and pain. The patient was working in [employer] on July 6 when he believes he had a spider bite to the right third finger. He presented to the ED on July 7 with a hemorrhagic blister and was felt to be a local reaction to the spider bite. He was sent home on cephalexin and requested to follow-up with the hand surgeon. The finger got worse and he presented back to the ED on the ninth. The patient was then taken to the operating room for incision and drainage of that finger. She did not have any deep infection only superficial dermal lysis. The wound was packed open. He has received IV antibiotics overnight.  This morning, he does complain of pain in that finger but had just received Tylenol in addition to the Toradol. He had some nausea earlier but eating breakfast help to decrease the nausea. He was able to eat breakfast without any further GI symptoms.  PMHx, Fam Hx, and/or Soc Hx : He has no significant past medical history although he has had several orthopedic surgeries. Social history is pertinent for lack of insurance. He works in [employer].  Vitals:   04/17/17 2004 04/18/17 0450  BP: 131/78 125/80  Pulse: (!) 58 70  Resp: 18 17  Temp: 97.9 F (36.6 C) 97.6 F (36.4 C)  Lying in bed, no overt distress but occasional grunting Keeping R arm elevated Mild edema over right hand. Bandage clean and intact. Able to move fingers and hand. Heart regular rhythm no murmurs or gallops  Cr 1.05 LA 2.01 WBC 10.7 - 9.4 HgB 14 pre-op  I independently reviewed his right hand x-ray which showed soft tissue swelling of the right third digit  Assessment and Plan: I have seen and evaluated the patient as  outlined above. I agree with the formulated Assessment and Plan as detailed in the residents' note, with the following changes:  Mr. [name] is a 61 year old man who sustained an injury to his right third finger which has been presumed to be a spider although he did not observe a spider on his finger or in the vicinity where he was working. Regardless, he had worsening of his right third finger pain, edema, and extent of injury and she presented for further management. He has since been taken to the OR where no necrosis was found that an I&D was performed and the wound packed. He has received slightly less than 24 hours of IV antibiotics and will be able to be transitioned over to oral antibiotics which he can complete at home. Otherwise, he has stable. He has no insurance and we will take that into consideration as a prescribed outpatient antibiotics. He has already spoken to the surgeons regarding his postoperative outpatient care.  1. Right third finger wound -it is unknown what the inciting event was which led to his finger injury. As no insect was seen for capture, we can only speculate. Now the patient has had surgery and IV antibiotics. He is improving and will be safe to be discharged to home to complete oral antibiotics.  Dispo - D/C home today  Burns SpainButcher, Elizabeth A, MD 7/10/201810:58 AM

## 2017-04-18 NOTE — Care Management Note (Addendum)
Case Management Note  Patient Details  Name: Leonard Rowe MRN: [id number] Date of Birth: 09/15/1956  Subjective/Objective:              Presented with insect bite. From home with wife. Independent with ADL's and no DME usage.  04/18/2017   s/p  Incision and drainage, right long finger    Particia LatherConnie Rowe (Spouse)     [phone]       PCP: Pt is without PCP. [facility] information given to pt per CM.  Pt is to call clinic on Friday @ 8:30 am to arrange post hospital f/u appointment Wayna Chalet/establisb PCP.  [facility] except for narcotics M-F , 8:30 am-5:30 pm. CM made pt and wife aware.  Action/Plan: Plan is to d/c to home today.  Expected Discharge Date:   04/18/2017             Expected Discharge Plan:  Home/Self Care  In-House Referral:     Discharge planning Services  CM Consult, [facility] information explained and provided to pt per CM)  Status of Service:  Completed, signed off  If discussed at Long Length of Stay Meetings, dates discussed:    Additional Comments:  Epifanio LeschesCole, Rashad Obeid Hudson, RN 04/18/2017, 10:43 AM

## 2017-04-18 NOTE — Progress Notes (Signed)
Subjective: Leonard Rowe was seen this morning in his room with his wife at bedside. He reports R hand edema and pain that is poorly managed with Tylenol. He was elevating his R hand while in bed because he felt it helped with the throbbing sensation. He also reports "spasms" that occur every few minutes. Finger mobility and sensation same as yesterday. He was also concerned about a pruritic macular rash on his L arm that started approximately 3 days ago.  Of note, Leonard Rowe does not have medical insurance so we will need to take that into consideration when prescribing his antibiotics to minimize cost.  Objective: Vital signs in last 24 hours: Vitals:   04/17/17 1930 04/17/17 1934 04/17/17 2004 04/18/17 0450  BP:   131/78 125/80  Pulse: (!) 59  (!) 58 70  Resp: 17  18 17   Temp:  (!) 97.5 F (36.4 C) 97.9 F (36.6 C) 97.6 F (36.4 C)  TempSrc:   Oral Oral  SpO2: 99%  100% 99%    Intake/Output Summary (Last 24 hours) at 04/18/17 1028 Last data filed at 04/18/17 0300  Gross per 24 hour  Intake             1930 ml  Output                0 ml  Net             1930 ml   BP 125/80 (BP Location: Left Arm)   Pulse 70   Temp 97.6 F (36.4 C) (Oral)   Resp 17   SpO2 99%  General appearance: alert, cooperative, appears stated age and mild distress Lungs: clear to auscultation bilaterally Heart: regular rate and rhythm, S1, S2 normal, no murmur, click, rub or gallop Extremities: lower extremities and R hand normal, atraumatic, no cyanosis or edema. R middle finger bandaged from debridement with limited mobility, erythema, mildly edemia, pain Skin: erythematous, pruritic macular rash 1-2in below L antecubital  Lab Results: Hg/HCT 12.9/38.6 HIV nonreactive Micro Results: Recent Results (from the past 240 hour(s))  Culture, blood (routine x 2)     Status: None (Preliminary result)   Collection Time: 04/17/17 11:40 AM  Result Value Ref Range Status   Specimen Description BLOOD RIGHT  ANTECUBITAL  Final   Special Requests   Final    BOTTLES DRAWN AEROBIC AND ANAEROBIC Blood Culture results may not be optimal due to an excessive volume of blood received in culture bottles   Culture NO GROWTH < 24 HOURS  Final   Report Status PENDING  Incomplete  Culture, blood (routine x 2)     Status: None (Preliminary result)   Collection Time: 04/17/17 11:55 AM  Result Value Ref Range Status   Specimen Description BLOOD LEFT ANTECUBITAL  Final   Special Requests   Final    BOTTLES DRAWN AEROBIC AND ANAEROBIC Blood Culture adequate volume   Culture NO GROWTH < 24 HOURS  Final   Report Status PENDING  Incomplete  Aerobic/Anaerobic Culture (surgical/deep wound)     Status: None (Preliminary result)   Collection Time: 04/17/17  6:10 PM  Result Value Ref Range Status   Specimen Description WOUND  Final   Special Requests RIGHT MIDDLE FINGER  Final   Gram Stain   Final    MODERATE WBC PRESENT, PREDOMINANTLY PMN RARE GRAM POSITIVE COCCI RARE GRAM NEGATIVE RODS    Culture PENDING  Incomplete   Report Status PENDING  Incomplete   Studies/Results: Dg Hand Complete  Right  Result Date: 04/17/2017 CLINICAL DATA:  Middle finger, hand swelling, spider bite EXAM: RIGHT HAND - COMPLETE 3+ VIEW COMPARISON:  04/15/2017 FINDINGS: Soft tissue swelling about the PIP joint of the right middle finger. No underlying bony abnormality. No fracture, subluxation or dislocation. No radiopaque foreign bodies. IMPRESSION: Soft tissue swelling in the right middle finger. No acute bony abnormality or radiopaque foreign body. Electronically Signed   By: Charlett Nose M.D.   On: 04/17/2017 10:25   Medications: I have reviewed the patient's current medications. Scheduled Meds: . chlorhexidine      . enoxaparin (LOVENOX) injection  40 mg Subcutaneous Q24H   Continuous Infusions: .  ceFAZolin (ANCEF) IV 1 g (04/18/17 0604)  . lactated ringers 10 mL/hr at 04/17/17 1620  . vancomycin 1,250 mg (04/18/17 0852)    PRN Meds:.acetaminophen **OR** acetaminophen, diphenhydrAMINE **OR** diphenhydrAMINE, ketorolac, ondansetron **OR** ondansetron (ZOFRAN) IV Assessment/Plan: Principal Problem:   Insect bite Active Problems:   Finger swelling  Leonard Rowe is a 61yo M with suspected spider bite on his R middle finger (L 3rd PIP) s/p surgical debridement. His culture came back notable for GP cocci and GN rods. His Hg/HCT is slightly decreased this morning, which is not abnormal post-operatively. He is otherwise stable and well-appearing.  Presumed spider bite s/p debridement - Tylenol and Toradol for pain - Switch to PO vancomycin and cefazolin abx prophy  Pruritic rash 1-2in below L antecubital space - Benadryl for itching  FEN: regular diet  Dispo: Discharge patient today after clearance from Surgery.  This is a Psychologist, occupational Note.  The care of the patient was discussed with Dr. Rogelia Boga and the assessment and plan formulated with their assistance.  Please see their attached note for official documentation of the daily encounter.   LOS: 0 days   Donnalee Curry, Medical Student 04/18/2017, 10:28 AM 04/18/2017, 10:28 AM

## 2017-04-18 NOTE — Progress Notes (Signed)
Patient ID: Leonard Rowe, male   DOB: 08/24/1956, 61 y.o.   MRN: [id number]   Subjective: Mr/ Leonard Rowe was seen in his room with his wife present. He states that he has been having pain and intermittent spasm of the R hand following surgery yesterday. He does stated that the burning pain has resolved and the he was able to rest better last night than the 2 nights prior. His surgical site is cleanly wrapped. He continues to have tenderness of his 2-4 MIP joints. He ate some Nausea this morning, but it was relieved by having breakfast. He states he has a  surgery follow up appointment scheduled for Thursday 04/20/17.  Objective:  Vital signs in last 24 hours: Vitals:   04/17/17 1930 04/17/17 1934 04/17/17 2004 04/18/17 0450  BP:   131/78 125/80  Pulse: (!) 59  (!) 58 70  Resp: 17  18 17   Temp:  (!) 97.5 F (36.4 C) 97.9 F (36.6 C) 97.6 F (36.4 C)  TempSrc:   Oral Oral  SpO2: 99%  100% 99%   Physical Exam  Constitutional: He is oriented to person, place, and time. He appears well-developed and well-nourished.  HENT:  Head: Atraumatic. Residual scaring and papules  Eyes: EOM are normal. No scleral icterus.  Cardiovascular: Normal rate and regular rhythm.  Exam reveals no gallop and no friction rub.   No murmur heard. Pulmonary/Chest: Effort normal and breath sounds normal. No respiratory distress.  Abdominal: Soft. Bowel sounds are normal. He exhibits no distension. There is no tenderness.  Musculoskeletal:  R 3rd Digit Surgical Site Cleanly Wrapped. Tenderness to palpation of 2-4 MIPs.  Neurological: He is alert and oriented to person, place, and time.  Skin: Skin is dry and warm.   Assessment/Plan:  Assessment & Plan by Problem:  Right Third Digit Wound: Presumed bite based on history and initial presetation with puncture sites, but no spider/insect seen/collected by patient. Severe local presentation with some systemic response in the form of itching.  - Surgery yesterday (7/9),  Follow up 7/12 - Benadryl for itching - Tylenol  And Toradol for pain. - Started on empiric Vancomycin and Ancef overnight, transition to orals based on culture - Intraoperative culture: RARE GRAM POSITIVE COCCI, RARE GRAM NEGATIVE RODS   F/E/N: Regular Diet  Dispo: Anticipated discharge Later Today.  Leonard CordAlexander Melvin, DO IM PGY-1 Pager: [phone]

## 2017-04-22 LAB — CULTURE, BLOOD (ROUTINE X 2)
Culture: NO GROWTH
Culture: NO GROWTH
Special Requests: ADEQUATE

## 2017-04-22 LAB — AEROBIC/ANAEROBIC CULTURE (SURGICAL/DEEP WOUND)

## 2017-04-22 LAB — AEROBIC/ANAEROBIC CULTURE W GRAM STAIN (SURGICAL/DEEP WOUND)

## 2018-01-25 ENCOUNTER — Ambulatory Visit (HOSPITAL_COMMUNITY)
Admission: EM | Admit: 2018-01-25 | Discharge: 2018-01-25 | Disposition: A | Discharge status: Home / Self Care | Payer: Self-pay | Attending: Family Medicine | Admitting: Family Medicine

## 2018-01-25 ENCOUNTER — Encounter (HOSPITAL_COMMUNITY): Payer: Self-pay | Admitting: Family Medicine

## 2018-01-25 DIAGNOSIS — J0100 Acute maxillary sinusitis, unspecified: Secondary | ICD-10-CM

## 2018-01-25 MED ORDER — METHYLPREDNISOLONE SODIUM SUCC 125 MG IJ SOLR
INTRAMUSCULAR | Status: AC
  Filled 2018-01-25: qty 2

## 2018-01-25 MED ORDER — AMOXICILLIN-POT CLAVULANATE 875-125 MG PO TABS: 1.0000 | ORAL_TABLET | Freq: Two times a day (BID) | ORAL | 0 refills | Status: DC

## 2018-01-25 MED ORDER — METHYLPREDNISOLONE SODIUM SUCC 125 MG IJ SOLR
80.0000 mg | Freq: Once | INTRAMUSCULAR | Status: AC
  Administered 2018-01-25: 80 mg via INTRAMUSCULAR

## 2018-01-25 NOTE — ED Provider Notes (Signed)
[facility]    CSN: [id number] Arrival date & time: 01/25/18  1837     History   Chief Complaint Chief Complaint  Patient presents with  . Cough  . Nasal Congestion    HPI patient with history of cough congestion sinus pain pressure sore throat.  Has not had any fever or chills.  History of frequent bronchitis. Leonard Rowe is a 62 y.o. male.   HPI  History reviewed. No pertinent past medical history.  Patient Active Problem List   Diagnosis Date Noted  . Insect bite 04/17/2017  . Finger swelling     Past Surgical History:  Procedure Laterality Date  . APPENDECTOMY    . CHOLECYSTECTOMY    . I&D EXTREMITY Right 04/17/2017   Procedure: IRRIGATION AND DEBRIDEMENT EXTREMITY RIGHT MIDDLE FINGER;  Surgeon: Dairl Ponder, MD;  Location: [facility] OR;  Service: Orthopedics;  Laterality: Right;  . KNEE SURGERY    . ROTATOR CUFF REPAIR         Home Medications    Prior to Admission medications   Medication Sig Start Date End Date Taking? Authorizing Provider  Aspirin-Salicylamide-Caffeine (BC HEADACHE POWDER PO) Take 1 packet by mouth daily as needed (pain).    [provider]  cephALEXin (KEFLEX) 500 MG capsule Take 1 capsule (500 mg total) by mouth 4 (four) times daily. 04/15/17   Janne Napoleon, NP  cetirizine (ZYRTEC) 10 MG chewable tablet Chew 10 mg by mouth 2 (two) times a week.     [provider]    Family History Family History  Problem Relation Age of Onset  . Heart disease Father     Social History Social History   Tobacco Use  . Smoking status: Former Smoker    Packs/day: 0.25    Types: Cigarettes  . Smokeless tobacco: Never Used  Substance Use Topics  . Alcohol use: No  . Drug use: No     Allergies   Codeine   Review of Systems Review of Systems  Constitutional: Positive for chills.  HENT: Positive for congestion and sore throat.   Respiratory: Positive for wheezing.   Cardiovascular: Negative.       Physical Exam Triage Vital Signs ED Triage Vitals  Enc Vitals Group     BP 01/25/18 1931 (!) 157/97     Pulse Rate 01/25/18 1931 74     Resp 01/25/18 1931 18     Temp 01/25/18 1931 98.5 F (36.9 C)     Temp src --      SpO2 01/25/18 1931 100 %     Weight --      Height --      Head Circumference --      Peak Flow --      Pain Score 01/25/18 1932 6     Pain Loc --      Pain Edu? --      Excl. in GC? --    No data found.  Updated Vital Signs BP (!) 157/97   Pulse 74   Temp 98.5 F (36.9 C)   Resp 18   SpO2 100%   Visual Acuity Right Eye Distance:   Left Eye Distance:   Bilateral Distance:    Right Eye Near:   Left Eye Near:    Bilateral Near:     Physical Exam  Constitutional: He is oriented to person, place, and time. He appears well-developed and well-nourished.  HENT:  Right Ear: External ear normal.  Left Ear:  External ear normal.  Marked sinus tenderness to palpation  Cardiovascular: Normal rate, regular rhythm and normal heart sounds.  Pulmonary/Chest: Effort normal and breath sounds normal.  Neurological: He is alert and oriented to person, place, and time.     UC Treatments / Results  Labs (all labs ordered are listed, but only abnormal results are displayed) Labs Reviewed - No data to display  EKG None Radiology No results found.  Procedures Procedures (including critical care time)  Medications Ordered in UC Medications  methylPREDNISolone sodium succinate (SOLU-MEDROL) 125 mg/2 mL injection 80 mg (has no administration in time range)     Initial Impression / Assessment and Plan / UC Course  I have reviewed the triage vital signs and the nursing notes.  Pertinent labs & imaging results that were available during my care of the patient were reviewed by me and considered in my medical decision making (see chart for details).     Sinobronchitis.  Will treat with antibiotic steroid injection.  May use Mucinex and Flonase  OTC  Final Clinical Impressions(s) / UC Diagnoses   Final diagnoses:  None    ED Discharge Orders    None       Controlled Substance Prescriptions [place] Controlled Substance Registry consulted? No   Frederica KusterMiller, Daquann Merriott M, MD 01/25/18 1945

## 2018-01-25 NOTE — ED Triage Notes (Signed)
Pt here for cough, congestion, SOB and chest pain x 2 weeks.

## 2019-07-05 ENCOUNTER — Ambulatory Visit (HOSPITAL_COMMUNITY)
Admission: EM | Admit: 2019-07-05 | Discharge: 2019-07-05 | Disposition: A | Discharge status: Home / Self Care | Payer: Self-pay | Attending: Emergency Medicine | Admitting: Emergency Medicine

## 2019-07-05 ENCOUNTER — Other Ambulatory Visit: Payer: Self-pay

## 2019-07-05 ENCOUNTER — Encounter (HOSPITAL_COMMUNITY): Payer: Self-pay | Admitting: Emergency Medicine

## 2019-07-05 DIAGNOSIS — L729 Follicular cyst of the skin and subcutaneous tissue, unspecified: Secondary | ICD-10-CM

## 2019-07-05 DIAGNOSIS — L089 Local infection of the skin and subcutaneous tissue, unspecified: Secondary | ICD-10-CM

## 2019-07-05 MED ORDER — IBUPROFEN 800 MG PO TABS: 800.0000 mg | ORAL_TABLET | Freq: Three times a day (TID) | ORAL | 0 refills | Status: DC | PRN

## 2019-07-05 MED ORDER — DOXYCYCLINE HYCLATE 100 MG PO CAPS: 100.0000 mg | ORAL_CAPSULE | Freq: Two times a day (BID) | ORAL | 0 refills | Status: DC

## 2019-07-05 NOTE — ED Provider Notes (Signed)
[facility]    CSN: [id number] Arrival date & time: 07/05/19  1213      History   Chief Complaint Chief Complaint  Patient presents with  . Knee Problem    left    HPI Sumner Kirchman Damian is a 63 y.o. male.   Vayden W Grantham presents with complaints of "knot" to left knee. He had this approximately 2 months ago, it grew quite large, before it resolved on its own and shrunk back down. Was improved for approximately 1 month. Returned again about a week ago. Tender. Worse with activity. No drainage. No fevers. Denies any other previous similar. Has taken aspirin which helps some. 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 No injury. Pain is even to posterior knee at times. Had surgery to the "cartilage" of the knee approximately 30 years ago, no other previous knee history. No DM.     ROS per HPI, negative if not otherwise mentioned.      History reviewed. No pertinent past medical history.  Patient Active Problem List   Diagnosis Date Noted  . Insect bite 04/17/2017  . Finger swelling     Past Surgical History:  Procedure Laterality Date  . APPENDECTOMY    . CHOLECYSTECTOMY    . I&D EXTREMITY Right 04/17/2017   Procedure: IRRIGATION AND DEBRIDEMENT EXTREMITY RIGHT MIDDLE FINGER;  Surgeon: Dairl Ponder, MD;  Location: [facility];  Service: Orthopedics;  Laterality: Right;  . KNEE SURGERY    . ROTATOR CUFF REPAIR         Home Medications    Prior to Admission medications   Medication Sig Start Date End Date Taking? Authorizing Provider  cetirizine (ZYRTEC) 10 MG chewable tablet Chew 10 mg by mouth 2 (two) times a week.    Yes [provider]  Aspirin-Salicylamide-Caffeine (BC HEADACHE POWDER PO) Take 1 packet by mouth daily as needed (pain).    [provider]  doxycycline (VIBRAMYCIN) 100 MG capsule Take 1 capsule (100 mg total) by mouth 2 (two) times daily. 07/05/19   Georgetta Haber, NP  ibuprofen  (ADVIL) 800 MG tablet Take 1 tablet (800 mg total) by mouth every 8 (eight) hours as needed for mild pain or moderate pain. 07/05/19   Georgetta Haber, NP    Family History Family History  Problem Relation Age of Onset  . Heart disease Father     Social History Social History   Tobacco Use  . Smoking status: Former Smoker    Packs/day: 0.25    Types: Cigarettes  . Smokeless tobacco: Never Used  Substance Use Topics  . Alcohol use: No  . Drug use: No     Allergies   Codeine   Review of Systems Review of Systems   Physical Exam Triage Vital Signs ED Triage Vitals  Enc Vitals Group     BP 07/05/19 1225 131/73     Pulse Rate 07/05/19 1225 60     Resp 07/05/19 1225 18     Temp 07/05/19 1225 98.4 F (36.9 C)     Temp Source 07/05/19 1225 Oral     SpO2 07/05/19 1225 100 %     Weight --      Height --      Head Circumference --      Peak Flow --      Pain Score 07/05/19 1223 6     Pain Loc --      Pain Edu? --  Excl. in GC? --    No data found.  Updated Vital Signs BP 131/73 (BP Location: Right Arm)   Pulse 60   Temp 98.4 F (36.9 C) (Oral)   Resp 18   SpO2 100%    Physical Exam Constitutional:      Appearance: He is well-developed.  Cardiovascular:     Rate and Rhythm: Normal rate.  Pulmonary:     Effort: Pulmonary effort is normal.  Musculoskeletal:     Left knee: He exhibits swelling.       Legs:     Comments: Localized fluctuant raised nodule, cystic in nature with redness, tender, approximately 1 cm in diameter and in height; minimal surrounding redness; to medial patellar tendon   Skin:    General: Skin is warm and dry.  Neurological:     Mental Status: He is alert and oriented to person, place, and time.      UC Treatments / Results  Labs (all labs ordered are listed, but only abnormal results are displayed) Labs Reviewed - No data to display  EKG   Radiology No results found.  Procedures Incision and Drainage   Date/Time: 07/05/2019 2:41 PM Performed by: Georgetta HaberBurky,  B, NP Authorized by: Georgetta HaberBurky,  B, NP   Consent:    Consent obtained:  Verbal   Consent given by:  Patient   Risks discussed:  Bleeding, incomplete drainage, pain and infection   Alternatives discussed:  No treatment, delayed treatment, observation, alternative treatment and referral Location:    Type:  Cyst   Size:  1   Location:  Lower extremity   Lower extremity location:  Knee   Knee location:  L knee Pre-procedure details:    Skin preparation:  Antiseptic wash Anesthesia (see MAR for exact dosages):    Anesthesia method:  Topical application   Topical anesthesia: pain ease freeze spray  Procedure type:    Complexity:  Simple Procedure details:    Needle aspiration: yes     Needle size:  18 G   Drainage:  Purulent and bloody   Drainage amount:  Moderate   Packing materials:  None Post-procedure details:    Patient tolerance of procedure:  Tolerated well, no immediate complications   (including critical care time)  Medications Ordered in UC Medications - No data to display  Initial Impression / Assessment and Plan / UC Course  I have reviewed the triage vital signs and the nursing notes.  Pertinent labs & imaging results that were available during my care of the patient were reviewed by me and considered in my medical decision making (see chart for details).     Needle aspiration of what appears to be cyst to left knee. Thick bloody drainage decompressed the affected area. Tight coban wrap applied for compression. Will cover with antibiotics, nsaids to help. Follow up with ortho prn as this may recur. Return precautions provided. Patient verbalized understanding and agreeable to plan.   Final Clinical Impressions(s) / UC Diagnoses   Final diagnoses:  Infected cyst of skin     Discharge Instructions     I feel this is likely a cyst, and will likely recur.  Wrapping it with compression or an ACE  wrap may be helpful to prevent recurrence today.  I am worried it is infected today so we will cover for infection.  Ibuprofen as needed for pain and swelling.  Please follow up with sports medicine for any persistent issues or recurrence.    ED Prescriptions  Medication Sig Dispense Auth. Provider   doxycycline (VIBRAMYCIN) 100 MG capsule Take 1 capsule (100 mg total) by mouth 2 (two) times daily. 20 capsule Augusto Gamble B, NP   ibuprofen (ADVIL) 800 MG tablet Take 1 tablet (800 mg total) by mouth every 8 (eight) hours as needed for mild pain or moderate pain. 21 tablet Zigmund Gottron, NP     PDMP not reviewed this encounter.   Zigmund Gottron, NP 07/05/19 1443

## 2019-07-05 NOTE — Discharge Instructions (Signed)
I feel this is likely a cyst, and will likely recur.  Wrapping it with compression or an ACE wrap may be helpful to prevent recurrence today.  I am worried it is infected today so we will cover for infection.  Ibuprofen as needed for pain and swelling.  Please follow up with sports medicine for any persistent issues or recurrence.

## 2019-07-05 NOTE — ED Triage Notes (Signed)
Pt reports he has a knot below his left knee that appeared about one week ago.  He states he had it a few months ago and it was there for about 3 weeks then went away.

## 2019-08-23 ENCOUNTER — Ambulatory Visit (INDEPENDENT_AMBULATORY_CARE_PROVIDER_SITE_OTHER): Payer: Self-pay

## 2019-08-23 ENCOUNTER — Other Ambulatory Visit: Payer: Self-pay

## 2019-08-23 ENCOUNTER — Ambulatory Visit (HOSPITAL_COMMUNITY)
Admission: EM | Admit: 2019-08-23 | Discharge: 2019-08-23 | Disposition: A | Discharge status: Home / Self Care | Payer: Self-pay | Attending: Urgent Care | Admitting: Urgent Care

## 2019-08-23 ENCOUNTER — Encounter (HOSPITAL_COMMUNITY): Payer: Self-pay | Admitting: Emergency Medicine

## 2019-08-23 DIAGNOSIS — H9201 Otalgia, right ear: Secondary | ICD-10-CM

## 2019-08-23 DIAGNOSIS — S91112A Laceration without foreign body of left great toe without damage to nail, initial encounter: Secondary | ICD-10-CM

## 2019-08-23 DIAGNOSIS — H6123 Impacted cerumen, bilateral: Secondary | ICD-10-CM

## 2019-08-23 DIAGNOSIS — M79675 Pain in left toe(s): Secondary | ICD-10-CM

## 2019-08-23 DIAGNOSIS — W228XXA Striking against or struck by other objects, initial encounter: Secondary | ICD-10-CM

## 2019-08-23 MED ORDER — CEPHALEXIN 500 MG PO CAPS: 500.0000 mg | ORAL_CAPSULE | Freq: Three times a day (TID) | ORAL | 0 refills | Status: DC

## 2019-08-23 NOTE — ED Provider Notes (Signed)
[facility]   MRN: [id number] DOB: [birth year]  Subjective:   Leonard Rowe is a 63 y.o. male presenting for 2 chief complaints: Left great toe pain and right-sided ear pain.  Toe pain - Reports 4-day history of acute onset persistent and worsening left great toe pain.  Symptoms started after he accidentally stubbed his toe while he was running.  He actually suffered a laceration patient states that he is tried to keep his wound clean and covered but is having increasing difficulty with walking and bearing weight from his pain.  Denies redness, drainage of pus or active bleeding, loss of sensation, red streaks along his foot or lower leg, nausea, vomiting, malaise.  Right ear pain - Reports 1 week history of recurrent worsening moderate to severe right ear pain.  Pain is now radiating into his jaw on his neck.  States that he also has chronic allergies but does not take anything consistently for this.  In recent years, patient has had persistent difficulty with bilateral ear wax buildup.  Has had to have multiple procedures to remove earwax.  Denies fever, sinus pain, teeth pain, sore throat, cough.  Denies smoking cigarettes.  No current facility-administered medications for this encounter.   Current Outpatient Medications:  .  cetirizine (ZYRTEC) 10 MG chewable tablet, Chew 10 mg by mouth 2 (two) times a week. , Disp: , Rfl:  .  Aspirin-Salicylamide-Caffeine (BC HEADACHE POWDER PO), Take 1 packet by mouth daily as needed (pain)., Disp: , Rfl:  .  doxycycline (VIBRAMYCIN) 100 MG capsule, Take 1 capsule (100 mg total) by mouth 2 (two) times daily., Disp: 20 capsule, Rfl: 0 .  ibuprofen (ADVIL) 800 MG tablet, Take 1 tablet (800 mg total) by mouth every 8 (eight) hours as needed for mild pain or moderate pain., Disp: 21 tablet, Rfl: 0    Allergies  Allergen Reactions  . Codeine Hives and Itching    History reviewed. No pertinent past medical history.    Past Surgical History:   Procedure Laterality Date  . APPENDECTOMY    . CHOLECYSTECTOMY    . I&D EXTREMITY Right 04/17/2017   Procedure: IRRIGATION AND DEBRIDEMENT EXTREMITY RIGHT MIDDLE FINGER;  Surgeon: Dairl Ponder, MD;  Location: [facility];  Service: Orthopedics;  Laterality: Right;  . KNEE SURGERY    . ROTATOR CUFF REPAIR      Family History  Problem Relation Age of Onset  . Heart disease Father     Social History   Tobacco Use  . Smoking status: Former Smoker    Packs/day: 0.25    Types: Cigarettes  . Smokeless tobacco: Never Used  Substance Use Topics  . Alcohol use: No  . Drug use: No    ROS   Objective:   Vitals: BP (!) 159/76   Pulse (!) 52   Temp 98.8 F (37.1 C) (Temporal)   Resp 16   SpO2 100%   Physical Exam Constitutional:      General: He is not in acute distress.    Appearance: Normal appearance. He is well-developed and normal weight. He is not ill-appearing, toxic-appearing or diaphoretic.  HENT:     Head: Normocephalic and atraumatic.     Right Ear: External ear normal. There is impacted cerumen.     Left Ear: External ear normal. There is impacted cerumen.     Nose: Nose normal. No congestion or rhinorrhea.     Mouth/Throat:     Mouth: Mucous membranes are moist.  Pharynx: Oropharynx is clear. No oropharyngeal exudate or posterior oropharyngeal erythema.  Eyes:     General: No scleral icterus.       Right eye: No discharge.        Left eye: No discharge.     Extraocular Movements: Extraocular movements intact.     Conjunctiva/sclera: Conjunctivae normal.     Pupils: Pupils are equal, round, and reactive to light.  Neck:     Musculoskeletal: Normal range of motion and neck supple. No neck rigidity or muscular tenderness.  Cardiovascular:     Rate and Rhythm: Normal rate and regular rhythm.     Heart sounds: Normal heart sounds. No murmur. No friction rub. No gallop.   Pulmonary:     Effort: Pulmonary effort is normal. No respiratory distress.     Breath  sounds: Normal breath sounds. No stridor. No wheezing, rhonchi or rales.  Musculoskeletal:     Left foot: Decreased range of motion (secondary to pain). Normal capillary refill. Tenderness (about his wound) and laceration present. No swelling, crepitus or deformity.       Feet:     Comments: No erythema or swelling, ecchymosis.  Posterior tibial, dorsalis pedis 2+.  Neurological:     General: No focal deficit present.     Mental Status: He is alert and oriented to person, place, and time.  Psychiatric:        Mood and Affect: Mood normal.        Behavior: Behavior normal.        Thought Content: Thought content normal.        Judgment: Judgment normal.     Dg Toe Great Left  Result Date: 08/23/2019 CLINICAL DATA:  Left great toe pain since blunt trauma 3 days ago. Swelling and bruising. EXAM: LEFT GREAT TOE COMPARISON:  05/08/2013 FINDINGS: There is no evidence of fracture or dislocation. There is no evidence of arthropathy or other focal bone abnormality. Slight soft tissue swelling of the great toe. IMPRESSION: Soft tissue swelling. Otherwise, normal exam. No visible bone abnormality. Electronically Signed   By: Lorriane Shire M.D.   On: 08/23/2019 14:05    Ear lavage performed using mixture of peroxide and water.  Pressure irrigation performed using a bottle and a thin ear tube.  Subsequently an ear curette was needed to remove a cumulative ~3cc hardened cerumen from both ears.  TMs where visualized thereafter, both were opaque but without erythema and not bulging.   Assessment and Plan :   1. Right ear pain   2. Bilateral impacted cerumen   3. Great toe pain, left   4. Laceration of left great toe without foreign body present or damage to nail, initial encounter     Will cover for infection of his left great toe laceration with Keflex.  Counseled on wound care.  Patient is to use Debrox at home in the next few days to help prevent cerumen impaction. Counseled patient on  potential for adverse effects with medications prescribed/recommended today, ER and return-to-clinic precautions discussed, patient verbalized understanding.    Jaynee Eagles, PA-C 08/23/19 1504

## 2019-08-23 NOTE — ED Triage Notes (Signed)
PT reports right ear pain and fullness.   Also thinks he broke left great toe Tuesday night. He hit it on something while running.

## 2019-08-23 NOTE — Discharge Instructions (Signed)
Please change your non-stick (non-adherent) dressing 2-3 times daily. Do not apply any ointments or creams. Each time you change your dressing, make sure you clean gently around the perimeter of the wound with gentle soap and warm water. Pat your wound dry and let it air out if possible for 1-2 hours before reapplying another dressing.

## 2019-10-24 ENCOUNTER — Other Ambulatory Visit: Payer: Self-pay

## 2019-10-24 ENCOUNTER — Ambulatory Visit (INDEPENDENT_AMBULATORY_CARE_PROVIDER_SITE_OTHER): Payer: Self-pay

## 2019-10-24 ENCOUNTER — Ambulatory Visit (HOSPITAL_COMMUNITY)
Admission: EM | Admit: 2019-10-24 | Discharge: 2019-10-24 | Disposition: A | Discharge status: Home / Self Care | Payer: Self-pay | Attending: Family Medicine | Admitting: Family Medicine

## 2019-10-24 ENCOUNTER — Encounter (HOSPITAL_COMMUNITY): Payer: Self-pay

## 2019-10-24 DIAGNOSIS — S0101XA Laceration without foreign body of scalp, initial encounter: Secondary | ICD-10-CM

## 2019-10-24 DIAGNOSIS — R42 Dizziness and giddiness: Secondary | ICD-10-CM

## 2019-10-24 DIAGNOSIS — Z23 Encounter for immunization: Secondary | ICD-10-CM

## 2019-10-24 DIAGNOSIS — S5002XA Contusion of left elbow, initial encounter: Secondary | ICD-10-CM

## 2019-10-24 DIAGNOSIS — S060X0A Concussion without loss of consciousness, initial encounter: Secondary | ICD-10-CM

## 2019-10-24 DIAGNOSIS — W2209XA Striking against other stationary object, initial encounter: Secondary | ICD-10-CM

## 2019-10-24 MED ORDER — IBUPROFEN 800 MG PO TABS: 800.0000 mg | ORAL_TABLET | Freq: Three times a day (TID) | ORAL | 0 refills | Status: DC | PRN

## 2019-10-24 MED ORDER — TETANUS-DIPHTH-ACELL PERTUSSIS 5-2.5-18.5 LF-MCG/0.5 IM SUSP
0.5000 mL | Freq: Once | INTRAMUSCULAR | Status: AC
  Administered 2019-10-24: 13:00:00 0.5 mL via INTRAMUSCULAR

## 2019-10-24 MED ORDER — TETANUS-DIPHTH-ACELL PERTUSSIS 5-2.5-18.5 LF-MCG/0.5 IM SUSP
INTRAMUSCULAR | Status: AC
  Filled 2019-10-24: qty 0.5

## 2019-10-24 NOTE — Discharge Instructions (Addendum)
Watch the scalp wound for infection Ibuprofen for pain If your concussion symptoms get worse, increasing headache, vomiting, dizziness, drowsiness-go to the emergency room

## 2019-10-24 NOTE — ED Provider Notes (Signed)
[facility]    CSN: [id number] Arrival date & time: 10/24/19  1158      History   Chief Complaint Chief Complaint  Patient presents with  . Laceration    Head  . Arm Injury    Left    HPI Leonard Rowe is a 64 y.o. male.   HPI  Patient dropped to have a block and hit himself in the head while working underneath the house in a crawl space.  He did not have loss of conscious.  He did feel dazed for a few moments.  States that as he exited the crawlspace, he stood up and then fell backwards.  Hit his left elbow forcibly on the ground.  He states that he is dizzy with standing and movement.  He spent yesterday in bed. He states he had a lot of bleeding from the head wound.  He stopped this with pressure.  He does not know when his last tetanus shot was.  He states he continues to have some dizziness with movement.  No difficulty with vision.  No difficulty with thought or speech.  Normal use of arms and legs with the exception of the left elbow pain.  He feels like his balance is slightly off.  He has a headache.  We discussed possibility of a CT scan of his head given his concussion symptoms, he declines a trip to the emergency room. Patient has swelling and pain in his left elbow.  He has pain with range of motion.  He has pain with use.  He has been taking over-the-counter pain medication.  History reviewed. No pertinent past medical history.  Patient Active Problem List   Diagnosis Date Noted  . Insect bite 04/17/2017  . Finger swelling     Past Surgical History:  Procedure Laterality Date  . APPENDECTOMY    . CHOLECYSTECTOMY    . I & D EXTREMITY Right 04/17/2017   Procedure: IRRIGATION AND DEBRIDEMENT EXTREMITY RIGHT MIDDLE FINGER;  Surgeon: Dairl Ponder, MD;  Location: [facility];  Service: Orthopedics;  Laterality: Right;  . KNEE SURGERY    . ROTATOR CUFF REPAIR         Home Medications    Prior to Admission medications   Medication Sig Start Date End  Date Taking? Authorizing Provider  Aspirin-Salicylamide-Caffeine (BC HEADACHE POWDER PO) Take 1 packet by mouth daily as needed (pain).    [provider]  cetirizine (ZYRTEC) 10 MG chewable tablet Chew 10 mg by mouth 2 (two) times a week.     [provider]  ibuprofen (ADVIL) 800 MG tablet Take 1 tablet (800 mg total) by mouth every 8 (eight) hours as needed for mild pain or moderate pain. 10/24/19   Eustace Moore, MD    Family History Family History  Problem Relation Age of Onset  . Heart disease Father     Social History Social History   Tobacco Use  . Smoking status: Former Smoker    Packs/day: 0.25    Types: Cigarettes  . Smokeless tobacco: Never Used  Substance Use Topics  . Alcohol use: No  . Drug use: No     Allergies   Codeine   Review of Systems Review of Systems  Constitutional: Positive for activity change. Negative for fatigue.  HENT: Negative for hearing loss and trouble swallowing.   Eyes: Negative for visual disturbance.  Gastrointestinal: Negative for nausea and vomiting.  Musculoskeletal: Positive for arthralgias, neck pain and neck stiffness.  Skin: Positive for wound.  Neurological: Positive for dizziness and headaches.     Physical Exam Triage Vital Signs ED Triage Vitals  Enc Vitals Group     BP 10/24/19 1221 (!) 160/86     Pulse Rate 10/24/19 1221 70     Resp 10/24/19 1221 20     Temp 10/24/19 1221 98.2 F (36.8 C)     Temp Source 10/24/19 1221 Oral     SpO2 10/24/19 1221 99 %     Weight --      Height --      Head Circumference --      Peak Flow --      Pain Score 10/24/19 1224 7     Pain Loc --      Pain Edu? --      Excl. in GC? --    No data found.  Updated Vital Signs BP (!) 160/86 (BP Location: Right Arm)   Pulse 70   Temp 98.2 F (36.8 C) (Oral)   Resp 20   SpO2 99%     Physical Exam Constitutional:      General: He is not in acute distress.    Appearance: He is well-developed and normal  weight.  HENT:     Head: Normocephalic.      Comments: Abrasion just posterior to hairline.  Blood clot into the hair.  No bleeding    Mouth/Throat:     Comments: mask Eyes:     Extraocular Movements: Extraocular movements intact.     Conjunctiva/sclera: Conjunctivae normal.     Pupils: Pupils are equal, round, and reactive to light.  Neck:     Comments: Slow ROM Cardiovascular:     Rate and Rhythm: Normal rate and regular rhythm.     Heart sounds: Normal heart sounds.  Pulmonary:     Effort: Pulmonary effort is normal. No respiratory distress.     Breath sounds: Normal breath sounds.  Musculoskeletal:        General: Normal range of motion.       Arms:     Cervical back: Normal range of motion and neck supple.     Comments: Swelling and bruising to left elbow over proximal ulna  Skin:    General: Skin is warm and dry.  Neurological:     Mental Status: He is alert.  Psychiatric:        Attention and Perception: Attention normal.        Mood and Affect: Mood and affect normal.        Speech: Speech normal.        Behavior: Behavior normal.        Thought Content: Thought content normal.        Cognition and Memory: Cognition normal.        Judgment: Judgment normal.      UC Treatments / Results  Labs (all labs ordered are listed, but only abnormal results are displayed) Labs Reviewed - No data to display  EKG   Radiology DG Elbow Complete Left  Result Date: 10/24/2019 CLINICAL DATA:  Left elbow pain secondary to a fall two days ago. EXAM: LEFT ELBOW - COMPLETE 3+ VIEW COMPARISON:  Radiographs dated 12/02/2011 FINDINGS: No fracture or dislocation or joint effusion. Chondrocalcinosis. Slight edema in the subcutaneous fat posterior to the elbow. IMPRESSION: 1. No acute osseous abnormality. 2. Slight edema posterior to the elbow. 3. Chondrocalcinosis. Electronically Signed   By: Francene Boyers M.D.   On: 10/24/2019  13:33    Procedures Procedures (including critical  care time)  Medications Ordered in UC Medications  Tdap (BOOSTRIX) injection 0.5 mL (0.5 mLs Intramuscular Given 10/24/19 1247)    Initial Impression / Assessment and Plan / UC Course  I have reviewed the triage vital signs and the nursing notes.  Pertinent labs & imaging results that were available during my care of the patient were reviewed by me and considered in my medical decision making (see chart for details).     Reviewed the x-rays with patient.  No fractures.  Reviewed head injury/concussion symptoms.  Patient agrees to go the emergency room if he has worsening symptoms.  There is someone at home with him. Final Clinical Impressions(s) / UC Diagnoses   Final diagnoses:  Scalp laceration, initial encounter  Concussion without loss of consciousness, initial encounter  Contusion of left elbow, initial encounter     Discharge Instructions     Watch the scalp wound for infection Ibuprofen for pain If your concussion symptoms get worse, increasing headache, vomiting, dizziness, drowsiness-go to the emergency room   ED Prescriptions    Medication Sig Dispense Auth. Provider   ibuprofen (ADVIL) 800 MG tablet Take 1 tablet (800 mg total) by mouth every 8 (eight) hours as needed for mild pain or moderate pain. 21 tablet Raylene Everts, MD     PDMP not reviewed this encounter.   Raylene Everts, MD 10/24/19 [phone]

## 2019-10-24 NOTE — ED Triage Notes (Signed)
Pt presents with head laceration on front hairline area after accidentally dropping a cinder block on his head; pt states he did not have a LOC but got dizzy and fell backwards and slammed left elbow into another cinder block.  Pt states he has had a headache, some slight dizziness, and left arm pain since then.

## 2020-05-30 ENCOUNTER — Other Ambulatory Visit: Payer: Self-pay

## 2020-05-30 ENCOUNTER — Ambulatory Visit (HOSPITAL_COMMUNITY)
Admission: EM | Admit: 2020-05-30 | Discharge: 2020-05-30 | Disposition: A | Discharge status: Home / Self Care | Payer: Self-pay | Attending: Emergency Medicine | Admitting: Emergency Medicine

## 2020-05-30 ENCOUNTER — Encounter (HOSPITAL_COMMUNITY): Payer: Self-pay

## 2020-05-30 DIAGNOSIS — L739 Follicular disorder, unspecified: Secondary | ICD-10-CM

## 2020-05-30 MED ORDER — SULFAMETHOXAZOLE-TRIMETHOPRIM 800-160 MG PO TABS: 1.0000 | ORAL_TABLET | Freq: Two times a day (BID) | ORAL | 0 refills | Status: AC

## 2020-05-30 NOTE — ED Provider Notes (Signed)
[facility]    CSN: [id number] Arrival date & time: 05/30/20  1027      History   Chief Complaint Chief Complaint  Patient presents with  . possible shingles    HPI Leonard Rowe is a 64 y.o. male.   Leonard Rowe presents with complaints of  Rash to right scalp which started 1 week ago, worsening. Sore. Burning pain. Some drainage/ crusting noted to back of head when he wakes up. No known fevers. Chills last night. Has had shingles in the past to his chest. Feels similar. Face is not involved. Had chicken pox as a child. Has taken [facility] occasionally which hasn't helped.   ROS per HPI, negative if not otherwise mentioned.      History reviewed. No pertinent past medical history.  Patient Active Problem List   Diagnosis Date Noted  . Insect bite 04/17/2017  . Finger swelling     Past Surgical History:  Procedure Laterality Date  . APPENDECTOMY    . CHOLECYSTECTOMY    . I & D EXTREMITY Right 04/17/2017   Procedure: IRRIGATION AND DEBRIDEMENT EXTREMITY RIGHT MIDDLE FINGER;  Surgeon: Dairl Ponder, MD;  Location: [facility];  Service: Orthopedics;  Laterality: Right;  . KNEE SURGERY    . ROTATOR CUFF REPAIR         Home Medications    Prior to Admission medications   Medication Sig Start Date End Date Taking? Authorizing Provider  Aspirin-Salicylamide-Caffeine (BC HEADACHE POWDER PO) Take 1 packet by mouth daily as needed (pain).   Yes [provider]  cetirizine (ZYRTEC) 10 MG chewable tablet Chew 10 mg by mouth 2 (two) times a week.    Yes [provider]  ibuprofen (ADVIL) 800 MG tablet Take 1 tablet (800 mg total) by mouth every 8 (eight) hours as needed for mild pain or moderate pain. 10/24/19   Eustace Moore, MD  sulfamethoxazole-trimethoprim (BACTRIM DS) 800-160 MG tablet Take 1 tablet by mouth 2 (two) times daily for 7 days. 05/30/20 06/06/20  Georgetta Haber, NP    Family History Family History  Problem Relation Age of  Onset  . Heart disease Father     Social History Social History   Tobacco Use  . Smoking status: Former Smoker    Packs/day: 0.25    Types: Cigarettes  . Smokeless tobacco: Never Used  Substance Use Topics  . Alcohol use: No  . Drug use: No     Allergies   Codeine   Review of Systems Review of Systems   Physical Exam Triage Vital Signs ED Triage Vitals  Enc Vitals Group     BP 05/30/20 1147 (!) 167/85     Pulse Rate 05/30/20 1147 (!) 54     Resp 05/30/20 1147 18     Temp 05/30/20 1147 98 F (36.7 C)     Temp Source 05/30/20 1147 Oral     SpO2 05/30/20 1147 99 %     Weight --      Height --      Head Circumference --      Peak Flow --      Pain Score 05/30/20 1205 8     Pain Loc --      Pain Edu? --      Excl. in GC? --    No data found.  Updated Vital Signs BP (!) 167/85 (BP Location: Right Arm)   Pulse (!) 54   Temp 98 F (36.7 C) (Oral)  Resp 18   SpO2 99%   Visual Acuity Right Eye Distance:   Left Eye Distance:   Bilateral Distance:    Right Eye Near:   Left Eye Near:    Bilateral Near:     Physical Exam Constitutional:      Appearance: He is well-developed.  Cardiovascular:     Rate and Rhythm: Normal rate.  Pulmonary:     Effort: Pulmonary effort is normal.  Skin:    General: Skin is warm and dry.     Comments: Papules with redness at base, some with scabbing and crusting, scattered within hair at follicles, to occiput of head, crossing midline, tender   Neurological:     Mental Status: He is alert and oriented to person, place, and time.      UC Treatments / Results  Labs (all labs ordered are listed, but only abnormal results are displayed) Labs Reviewed - No data to display  EKG   Radiology No results found.  Procedures Procedures (including critical care time)  Medications Ordered in UC Medications - No data to display  Initial Impression / Assessment and Plan / UC Course  I have reviewed the triage vital  signs and the nursing notes.  Pertinent labs & imaging results that were available during my care of the patient were reviewed by me and considered in my medical decision making (see chart for details).     Findings consistent with folliculitis rather than shingles at this time with antibiotics provided. Return precautions provided. Patient verbalized understanding and agreeable to plan.   Final Clinical Impressions(s) / UC Diagnoses   Final diagnoses:  Folliculitis     Discharge Instructions     Warm soaks/ showers to the area to promote further drainage.  Complete course of antibiotics.  If symptoms worsen or do not improve in the next week to return to be seen or to follow up with your PCP.      ED Prescriptions    Medication Sig Dispense Auth. Provider   sulfamethoxazole-trimethoprim (BACTRIM DS) 800-160 MG tablet Take 1 tablet by mouth 2 (two) times daily for 7 days. 14 tablet Georgetta Haber, NP     PDMP not reviewed this encounter.   Georgetta Haber, NP 06/01/20 [phone]

## 2020-05-30 NOTE — ED Triage Notes (Signed)
Pt c/o painful, burning, itchy rash to posterior head for approx 1 week. Reports blister in appearance initially with "fluid leaking out".  Also reports chills and nausea yesterday. States h/o chicken pos as child. Denies vomiting.  Took BC Powder this morning at 0630 for pain. Small red lesions observed within hair/on scalp to posterior head.

## 2020-05-30 NOTE — Discharge Instructions (Signed)
Warm soaks/ showers to the area to promote further drainage.  Complete course of antibiotics.  If symptoms worsen or do not improve in the next week to return to be seen or to follow up with your PCP.

## 2020-08-12 ENCOUNTER — Encounter (HOSPITAL_COMMUNITY): Payer: Self-pay

## 2020-08-12 ENCOUNTER — Other Ambulatory Visit: Payer: Self-pay

## 2020-08-12 ENCOUNTER — Ambulatory Visit (HOSPITAL_COMMUNITY)
Admission: EM | Admit: 2020-08-12 | Discharge: 2020-08-12 | Disposition: A | Discharge status: Home / Self Care | Payer: Self-pay | Attending: Family Medicine | Admitting: Family Medicine

## 2020-08-12 DIAGNOSIS — L089 Local infection of the skin and subcutaneous tissue, unspecified: Secondary | ICD-10-CM | POA: Insufficient documentation

## 2020-08-12 LAB — COMPREHENSIVE METABOLIC PANEL
ALT: 14 U/L (ref 0–44)
AST: 24 U/L (ref 15–41)
Albumin: 4 g/dL (ref 3.5–5.0)
Alkaline Phosphatase: 50 U/L (ref 38–126)
Anion gap: 9 (ref 5–15)
BUN: 7 mg/dL — ABNORMAL LOW (ref 8–23)
CO2: 29 mmol/L (ref 22–32)
Calcium: 9.5 mg/dL (ref 8.9–10.3)
Chloride: 103 mmol/L (ref 98–111)
Creatinine, Ser: 0.87 mg/dL (ref 0.61–1.24)
GFR, Estimated: >60 mL/min (ref >60)
Glucose, Bld: 105 mg/dL — ABNORMAL HIGH (ref 70–99)
Potassium: 4.5 mmol/L (ref 3.5–5.1)
Sodium: 141 mmol/L (ref 135–145)
Total Bilirubin: 1.2 mg/dL (ref 0.3–1.2)
Total Protein: 6.7 g/dL (ref 6.5–8.1)

## 2020-08-12 LAB — CBC WITH DIFFERENTIAL/PLATELET
Abs Immature Granulocytes: 0.03 10*3/uL (ref 0.00–0.07)
Basophils Absolute: 0.1 10*3/uL (ref 0.0–0.1)
Basophils Relative: 1 %
Eosinophils Absolute: 0.2 10*3/uL (ref 0.0–0.5)
Eosinophils Relative: 2 %
HCT: 43.3 % (ref 39.0–52.0)
Hemoglobin: 14.3 g/dL (ref 13.0–17.0)
Immature Granulocytes: 0 %
Lymphocytes Relative: 22 %
Lymphs Abs: 2.1 10*3/uL (ref 0.7–4.0)
MCH: 32.1 pg (ref 26.0–34.0)
MCHC: 33 g/dL (ref 30.0–36.0)
MCV: 97.1 fL (ref 80.0–100.0)
Monocytes Absolute: 0.8 10*3/uL (ref 0.1–1.0)
Monocytes Relative: 8 %
Neutro Abs: 6.3 10*3/uL (ref 1.7–7.7)
Neutrophils Relative %: 67 %
Platelets: 355 10*3/uL (ref 150–400)
RBC: 4.46 MIL/uL (ref 4.22–5.81)
RDW: 13.1 % (ref 11.5–15.5)
WBC: 9.5 10*3/uL (ref 4.0–10.5)
nRBC: 0 % (ref 0.0–0.2)

## 2020-08-12 MED ORDER — LIDOCAINE HCL (PF) 1 % IJ SOLN
INTRAMUSCULAR | Status: AC
  Filled 2020-08-12: qty 2

## 2020-08-12 MED ORDER — CEFTRIAXONE SODIUM 1 G IJ SOLR
1.0000 g | Freq: Once | INTRAMUSCULAR | Status: AC
  Administered 2020-08-12: 1 g via INTRAMUSCULAR

## 2020-08-12 MED ORDER — KETOROLAC TROMETHAMINE 30 MG/ML IJ SOLN
INTRAMUSCULAR | Status: AC
  Filled 2020-08-12: qty 1

## 2020-08-12 MED ORDER — DOXYCYCLINE HYCLATE 100 MG PO CAPS: 100.0000 mg | ORAL_CAPSULE | Freq: Two times a day (BID) | ORAL | 0 refills | Status: DC

## 2020-08-12 MED ORDER — CEFTRIAXONE SODIUM 1 G IJ SOLR
INTRAMUSCULAR | Status: AC
  Filled 2020-08-12: qty 10

## 2020-08-12 MED ORDER — KETOROLAC TROMETHAMINE 30 MG/ML IJ SOLN
30.0000 mg | Freq: Once | INTRAMUSCULAR | Status: AC
  Administered 2020-08-12: 30 mg via INTRAMUSCULAR

## 2020-08-12 NOTE — ED Triage Notes (Signed)
Pt presents with recurrent skin infection on the back of neck X 1 month that has been unrelieved with prescribed medication.

## 2020-08-12 NOTE — Discharge Instructions (Addendum)
Starting you on doxycycline.  Giving you a shot of antibiotics here today. Ibuprofen or Tylenol for pain If your symptoms worsen you will need to go to the ER for a CT scan of the neck.

## 2020-08-12 NOTE — ED Provider Notes (Signed)
[facility]    CSN: [id number] Arrival date & time: 08/12/20  1022      History   Chief Complaint Chief Complaint  Patient presents with  . Skin Infection    HPI Leonard Rowe is a 64 y.o. male.   Patient is a 64 year old male presents today with recurrent skin infections to the back of his neck.  This has been an ongoing issue for the past few months.  Was seen here at the end of August and treated with Bactrim.  Reports he probably never really went away but did not get worse.  Now he is having severe pain, swelling to the posterior neck area with extension into scalp and going down into shoulders.  He has multiple sores to facial and ear area.  No fever, chills, body aches.  No injuries to the neck.     History reviewed. No pertinent past medical history.  Patient Active Problem List   Diagnosis Date Noted  . Insect bite 04/17/2017  . Finger swelling     Past Surgical History:  Procedure Laterality Date  . APPENDECTOMY    . CHOLECYSTECTOMY    . I & D EXTREMITY Right 04/17/2017   Procedure: IRRIGATION AND DEBRIDEMENT EXTREMITY RIGHT MIDDLE FINGER;  Surgeon: Dairl Ponder, MD;  Location: [facility];  Service: Orthopedics;  Laterality: Right;  . KNEE SURGERY    . ROTATOR CUFF REPAIR         Home Medications    Prior to Admission medications   Medication Sig Start Date End Date Taking? Authorizing Provider  Aspirin-Salicylamide-Caffeine (BC HEADACHE POWDER PO) Take 1 packet by mouth daily as needed (pain).    [provider]  cetirizine (ZYRTEC) 10 MG chewable tablet Chew 10 mg by mouth 2 (two) times a week.     [provider]  doxycycline (VIBRAMYCIN) 100 MG capsule Take 1 capsule (100 mg total) by mouth 2 (two) times daily. 08/12/20   Dahlia Byes A, NP  ibuprofen (ADVIL) 800 MG tablet Take 1 tablet (800 mg total) by mouth every 8 (eight) hours as needed for mild pain or moderate pain. 10/24/19   Eustace Moore, MD    Family  History Family History  Problem Relation Age of Onset  . Heart disease Father     Social History Social History   Tobacco Use  . Smoking status: Former Smoker    Packs/day: 0.25    Types: Cigarettes  . Smokeless tobacco: Never Used  Substance Use Topics  . Alcohol use: No  . Drug use: No     Allergies   Codeine   Review of Systems Review of Systems   Physical Exam Triage Vital Signs ED Triage Vitals  Enc Vitals Group     BP 08/12/20 1127 (!) 156/85     Pulse Rate 08/12/20 1127 88     Resp 08/12/20 1127 18     Temp 08/12/20 1127 97.8 F (36.6 C)     Temp Source 08/12/20 1127 Oral     SpO2 08/12/20 1127 100 %     Weight --      Height --      Head Circumference --      Peak Flow --      Pain Score 08/12/20 1125 6     Pain Loc --      Pain Edu? --      Excl. in GC? --    No data found.  Updated Vital Signs BP Marland Kitchen)  156/85 (BP Location: Right Arm)   Pulse 88   Temp 97.8 F (36.6 C) (Oral)   Resp 18   SpO2 100%   Visual Acuity Right Eye Distance:   Left Eye Distance:   Bilateral Distance:    Right Eye Near:   Left Eye Near:    Bilateral Near:     Physical Exam Vitals and nursing note reviewed.  Constitutional:      Appearance: Normal appearance.  HENT:     Head: Normocephalic and atraumatic.      Comments: Multiples sores to face    Nose: Nose normal.  Eyes:     Conjunctiva/sclera: Conjunctivae normal.  Neck:      Comments: Swelling, tenderness, increased warmth.  Pulmonary:     Effort: Pulmonary effort is normal.  Musculoskeletal:        General: Normal range of motion.     Cervical back: Normal range of motion.  Lymphadenopathy:     Cervical: Cervical adenopathy present.     Right cervical: Posterior cervical adenopathy present.     Left cervical: Posterior cervical adenopathy present.  Skin:    General: Skin is warm and dry.  Neurological:     Mental Status: He is alert.  Psychiatric:        Mood and Affect: Mood normal.       UC Treatments / Results  Labs (all labs ordered are listed, but only abnormal results are displayed) Labs Reviewed  CBC WITH DIFFERENTIAL/PLATELET  COMPREHENSIVE METABOLIC PANEL    EKG   Radiology No results found.  Procedures Procedures (including critical care time)  Medications Ordered in UC Medications  cefTRIAXone (ROCEPHIN) injection 1 g (has no administration in time range)  ketorolac (TORADOL) 30 MG/ML injection 30 mg (has no administration in time range)    Initial Impression / Assessment and Plan / UC Course  I have reviewed the triage vital signs and the nursing notes.  Pertinent labs & imaging results that were available during my care of the patient were reviewed by me and considered in my medical decision making (see chart for details).     Skin infection Concern for underlying abscess versus cellulitis or both. Based on exam we will go ahead and treat aggressively with Rocephin injection here in clinic.  Toradol given for pain. Sending home with doxycycline to take for the next 10 days Ibuprofen or Tylenol at home as needed Recommended for any continued or worsening problems he will need to go to the ER for CT scan. Final Clinical Impressions(s) / UC Diagnoses   Final diagnoses:  Skin infection     Discharge Instructions     Starting you on doxycycline.  Giving you a shot of antibiotics here today. Ibuprofen or Tylenol for pain If your symptoms worsen you will need to go to the ER for a CT scan of the neck.    ED Prescriptions    Medication Sig Dispense Auth. Provider   doxycycline (VIBRAMYCIN) 100 MG capsule Take 1 capsule (100 mg total) by mouth 2 (two) times daily. 20 capsule Dahlia Byes A, NP     PDMP not reviewed this encounter.   Janace Aris, NP 08/12/20 1215

## 2020-11-30 ENCOUNTER — Ambulatory Visit (HOSPITAL_COMMUNITY)
Admission: EM | Admit: 2020-11-30 | Discharge: 2020-11-30 | Disposition: A | Discharge status: Home / Self Care | Payer: Self-pay | Attending: Student | Admitting: Student

## 2020-11-30 ENCOUNTER — Encounter (HOSPITAL_COMMUNITY): Payer: Self-pay

## 2020-11-30 ENCOUNTER — Other Ambulatory Visit: Payer: Self-pay

## 2020-11-30 ENCOUNTER — Ambulatory Visit (INDEPENDENT_AMBULATORY_CARE_PROVIDER_SITE_OTHER): Payer: Self-pay

## 2020-11-30 DIAGNOSIS — R0781 Pleurodynia: Secondary | ICD-10-CM

## 2020-11-30 DIAGNOSIS — W19XXXA Unspecified fall, initial encounter: Secondary | ICD-10-CM

## 2020-11-30 DIAGNOSIS — S20212A Contusion of left front wall of thorax, initial encounter: Secondary | ICD-10-CM

## 2020-11-30 MED ORDER — DICLOFENAC SODIUM 75 MG PO TBEC: 75.0000 mg | DELAYED_RELEASE_TABLET | Freq: Two times a day (BID) | ORAL | 0 refills | Status: DC

## 2020-11-30 MED ORDER — TIZANIDINE HCL 2 MG PO CAPS: 2.0000 mg | ORAL_CAPSULE | Freq: Three times a day (TID) | ORAL | 0 refills | Status: DC

## 2020-11-30 MED ORDER — KETOROLAC TROMETHAMINE 60 MG/2ML IM SOLN
INTRAMUSCULAR | Status: AC
  Filled 2020-11-30: qty 2

## 2020-11-30 MED ORDER — KETOROLAC TROMETHAMINE 60 MG/2ML IM SOLN
60.0000 mg | Freq: Once | INTRAMUSCULAR | Status: AC
  Administered 2020-11-30: 60 mg via INTRAMUSCULAR

## 2020-11-30 NOTE — ED Triage Notes (Signed)
Pt reports falling and hitting the coffee table. Pt states his ribs hurt and states it is painful to move. Pt states he was chasing his dog and fell Thursday night. He states he had a knot on his head and reports that it is gone now.

## 2020-11-30 NOTE — ED Provider Notes (Signed)
[facility]    CSN: [id number] Arrival date & time: 11/30/20  6599      History   Chief Complaint Chief Complaint  Patient presents with  . Fall  . Rib Injury    HPI Leonard Rowe is a 65 y.o. male presenting 5 days following fall. History appendectomy, cholecystectomy, rotator cuff repair. He states he was chasing his dog and tripped, hitting his ribs on coffee table and forehead on doorjam. He initially had a lump on his forehead and left-sided rib pain,but since the injury, head has improved significantly- but ribs are hurting more. States is hurts to take a deep breath and to move. Pain radiates to the back of his ribs. Difficult to take a deep breath due to this pain. Denies chest pain at rest, dizziness, confusion, memory loss, pain down left arm, vision changes, headaches.      HPI  History reviewed. No pertinent past medical history.  Patient Active Problem List   Diagnosis Date Noted  . Insect bite 04/17/2017  . Finger swelling     Past Surgical History:  Procedure Laterality Date  . APPENDECTOMY    . CHOLECYSTECTOMY    . I & D EXTREMITY Right 04/17/2017   Procedure: IRRIGATION AND DEBRIDEMENT EXTREMITY RIGHT MIDDLE FINGER;  Surgeon: Dairl Ponder, MD;  Location: [facility];  Service: Orthopedics;  Laterality: Right;  . KNEE SURGERY    . ROTATOR CUFF REPAIR         Home Medications    Prior to Admission medications   Medication Sig Start Date End Date Taking? Authorizing Provider  diclofenac (VOLTAREN) 75 MG EC tablet Take 1 tablet (75 mg total) by mouth 2 (two) times daily. 11/30/20  Yes Rhys Martini, PA-C  tizanidine (ZANAFLEX) 2 MG capsule Take 1 capsule (2 mg total) by mouth 3 (three) times daily. 11/30/20  Yes Rhys Martini, PA-C  Aspirin-Salicylamide-Caffeine (BC HEADACHE POWDER PO) Take 1 packet by mouth daily as needed (pain).    [provider]  cetirizine (ZYRTEC) 10 MG chewable tablet Chew 10 mg by mouth 2 (two) times a  week.     [provider]  doxycycline (VIBRAMYCIN) 100 MG capsule Take 1 capsule (100 mg total) by mouth 2 (two) times daily. 08/12/20   Dahlia Byes A, NP  ibuprofen (ADVIL) 800 MG tablet Take 1 tablet (800 mg total) by mouth every 8 (eight) hours as needed for mild pain or moderate pain. 10/24/19   Eustace Moore, MD    Family History Family History  Problem Relation Age of Onset  . Heart disease Father     Social History Social History   Tobacco Use  . Smoking status: Former Smoker    Packs/day: 0.25    Types: Cigarettes  . Smokeless tobacco: Never Used  Substance Use Topics  . Alcohol use: No  . Drug use: No     Allergies   Codeine   Review of Systems Review of Systems  Musculoskeletal:       Left lower rib pain  All other systems reviewed and are negative.    Physical Exam Triage Vital Signs ED Triage Vitals  Enc Vitals Group     BP 11/30/20 0946 129/78     Pulse Rate 11/30/20 0946 66     Resp 11/30/20 0946 15     Temp 11/30/20 0946 98.5 F (36.9 C)     Temp Source 11/30/20 0946 Oral     SpO2 11/30/20 0946 100 %  Weight --      Height --      Head Circumference --      Peak Flow --      Pain Score 11/30/20 0944 10     Pain Loc --      Pain Edu? --      Excl. in GC? --    No data found.  Updated Vital Signs BP 129/78 (BP Location: Right Arm)   Pulse 66   Temp 98.5 F (36.9 C) (Oral)   Resp 15   SpO2 100%   Visual Acuity Right Eye Distance:   Left Eye Distance:   Bilateral Distance:    Right Eye Near:   Left Eye Near:    Bilateral Near:     Physical Exam Vitals reviewed.  Constitutional:      Appearance: Normal appearance.  HENT:     Head:     Comments: Frontal scalp very mildly TTP with scant swelling. No abrasion or laceration.    Nose: Nose normal.  Eyes:     Extraocular Movements: Extraocular movements intact.     Pupils: Pupils are equal, round, and reactive to light.  Cardiovascular:     Rate and Rhythm:  Normal rate and regular rhythm.     Heart sounds: Normal heart sounds.  Pulmonary:     Effort: Pulmonary effort is normal.     Breath sounds: Normal breath sounds.  Chest:     Comments: Left lateral 8th and 9th ribs with significant TTP. No ecchymosis or bony deformity palpated. ROM L arm intact without pain. No other ecchymosis, pain, or bony deformity. Abdominal:     Palpations: Abdomen is soft.     Tenderness: There is no abdominal tenderness. There is no guarding or rebound.  Neurological:     General: No focal deficit present.     Mental Status: He is alert and oriented to person, place, and time.     Comments: CN 2-12 intact  Psychiatric:        Mood and Affect: Mood normal.        Behavior: Behavior normal.        Thought Content: Thought content normal.        Judgment: Judgment normal.      UC Treatments / Results  Labs (all labs ordered are listed, but only abnormal results are displayed) Labs Reviewed - No data to display  EKG   Radiology DG Ribs Unilateral W/Chest Left  Result Date: 11/30/2020 CLINICAL DATA:  Left-sided rib pain after a fall, initial encounter. EXAM: LEFT RIBS AND CHEST - 3+ VIEW COMPARISON:  12/02/2011 and CT chest 09/21/2013. FINDINGS: Trachea is midline. Heart size normal. Minimal biapical pleural thickening. Lungs are clear. No pleural fluid. No pneumothorax. Dedicated views of the left ribs show no definite fracture. IMPRESSION: No acute findings. Electronically Signed   By: Leanna Battles M.D.   On: 11/30/2020 10:28    Procedures Procedures (including critical care time)  Medications Ordered in UC Medications  ketorolac (TORADOL) injection 60 mg (60 mg Intramuscular Given 11/30/20 1015)    Initial Impression / Assessment and Plan / UC Course  I have reviewed the triage vital signs and the nursing notes.  Pertinent labs & imaging results that were available during my care of the patient were reviewed by me and considered in my medical  decision making (see chart for details).      This patient is a 65 year old male presenting with left-sided rib pain following fall  at home. Denies chest pain at rest; pain is reproducible.   Xray L chest/ribs: negative.   For rib contusion- zanaflex and voltaren as below.  Strict return precautions- follow-up with ortho if pain worsens or persists; seek immediate medical attention if chest pain at rest, shortness of breath, worst headache of life, etc. He verbalizes understanding and agreement.   Spent over 40 minutes obtaining H&P, performing physical, interpreting films, discussing results, treatment plan and plan for follow-up with patient. Patient agrees with plan.     Final Clinical Impressions(s) / UC Diagnoses   Final diagnoses:  Contusion of rib on left side, initial encounter     Discharge Instructions     -Your ribs aren't broken, but they are bruised. This can be very painful.  -Start the pain medication- Voltaren, as needed up to twice daily. Make sure to take this with food.  -You can also take tylenol while taking Voltaren, but make sure to avoid ibuprofen, advil, etc.  -You can also use the muscle relaxer- Zanaflex (Tizanidine), up to 3x daily for pain. This can make you drowsy, so take before bed or when you don't have to drive.  -If your symptoms worsen/persist, make an appointment with orthopedist for further evaluation (information below).  -If you have chest pain at rest, pain down left arm, worst headache of your life, dizziness, etc- head straight to the ED.    ED Prescriptions    Medication Sig Dispense Auth. Provider   diclofenac (VOLTAREN) 75 MG EC tablet Take 1 tablet (75 mg total) by mouth 2 (two) times daily. 30 tablet Rhys Martini, PA-C   tizanidine (ZANAFLEX) 2 MG capsule Take 1 capsule (2 mg total) by mouth 3 (three) times daily. 21 capsule Rhys Martini, PA-C     PDMP not reviewed this encounter.   Rhys Martini, PA-C 11/30/20  1047

## 2020-11-30 NOTE — Discharge Instructions (Addendum)
-  Your ribs aren't broken, but they are bruised. This can be very painful.  -Start the pain medication- Voltaren, as needed up to twice daily. Make sure to take this with food.  -You can also take tylenol while taking Voltaren, but make sure to avoid ibuprofen, advil, etc.  -You can also use the muscle relaxer- Zanaflex (Tizanidine), up to 3x daily for pain. This can make you drowsy, so take before bed or when you don't have to drive.  -If your symptoms worsen/persist, make an appointment with orthopedist for further evaluation (information below).  -If you have chest pain at rest, pain down left arm, worst headache of your life, dizziness, etc- head straight to the ED.

## 2021-04-15 ENCOUNTER — Encounter (HOSPITAL_COMMUNITY): Payer: Self-pay

## 2021-04-15 ENCOUNTER — Ambulatory Visit (HOSPITAL_COMMUNITY)
Admission: EM | Admit: 2021-04-15 | Discharge: 2021-04-15 | Disposition: A | Discharge status: Home / Self Care | Payer: Self-pay | Attending: Emergency Medicine | Admitting: Emergency Medicine

## 2021-04-15 ENCOUNTER — Other Ambulatory Visit: Payer: Self-pay

## 2021-04-15 DIAGNOSIS — K0889 Other specified disorders of teeth and supporting structures: Secondary | ICD-10-CM

## 2021-04-15 MED ORDER — LIDOCAINE VISCOUS HCL 2 % MT SOLN
OROMUCOSAL | Status: AC
  Filled 2021-04-15: qty 15

## 2021-04-15 MED ORDER — LIDOCAINE VISCOUS HCL 2 % MT SOLN
15.0000 mL | Freq: Once | OROMUCOSAL | Status: AC
  Administered 2021-04-15: 15 mL via OROMUCOSAL

## 2021-04-15 MED ORDER — AMOXICILLIN 500 MG PO CAPS: 500.0000 mg | ORAL_CAPSULE | Freq: Three times a day (TID) | ORAL | 0 refills | Status: DC

## 2021-04-15 MED ORDER — LIDOCAINE VISCOUS HCL 2 % MT SOLN: 15.0000 mL | OROMUCOSAL | 0 refills | Status: DC | PRN

## 2021-04-15 MED ORDER — KETOROLAC TROMETHAMINE 30 MG/ML IJ SOLN
30.0000 mg | Freq: Once | INTRAMUSCULAR | Status: AC
  Administered 2021-04-15: 30 mg via INTRAMUSCULAR

## 2021-04-15 MED ORDER — KETOROLAC TROMETHAMINE 30 MG/ML IJ SOLN
INTRAMUSCULAR | Status: AC
  Filled 2021-04-15: qty 1

## 2021-04-15 NOTE — ED Triage Notes (Addendum)
Pt reports pain in left upper molar for 2 weeks. Some swelling noted to gum area. Reports left side of face has felt numb for about 3 days and reports pain in left cheekbone. Reports taking BC powder, aleve and advil without relief.

## 2021-04-15 NOTE — Discharge Instructions (Addendum)
Take antibiotic three times a day for 7 days  Can use 15 ml of mouth solution every 4 hours for pain, swish and spit  Can take two aleve pills every morning and every evening to help with pain   Try to follow up with a dentist for evaluation of the tooth

## 2021-04-15 NOTE — ED Provider Notes (Signed)
[facility]    CSN: [id number] Arrival date & time: 04/15/21  1246      History   Chief Complaint Chief Complaint  Patient presents with   Dental Pain    HPI Leonard Rowe is a 65 y.o. male.   Patient presents with left upper dental pain for two weeks. Over the last three  days has caused left facial swelling and numbness. No difficulty swallowing but painful, poor appetite due to pain, able to tolerate fluids. Attempted use of aleve, advil and BC powder with no relief. Has not seen as dentist since childhood, missing multiple teeth per patient . Currently has no insurance.    History reviewed. No pertinent past medical history.  Patient Active Problem List   Diagnosis Date Noted   Insect bite 04/17/2017   Finger swelling     Past Surgical History:  Procedure Laterality Date   APPENDECTOMY     CHOLECYSTECTOMY     I & D EXTREMITY Right 04/17/2017   Procedure: IRRIGATION AND DEBRIDEMENT EXTREMITY RIGHT MIDDLE FINGER;  Surgeon: Dairl Ponder, MD;  Location: [facility];  Service: Orthopedics;  Laterality: Right;   KNEE SURGERY     ROTATOR CUFF REPAIR         Home Medications    Prior to Admission medications   Medication Sig Start Date End Date Taking? Authorizing Provider  amoxicillin (AMOXIL) 500 MG capsule Take 1 capsule (500 mg total) by mouth 3 (three) times daily. 04/15/21  Yes Emony Dormer R, NP  Aspirin-Salicylamide-Caffeine (BC HEADACHE POWDER PO) Take 1 packet by mouth daily as needed (pain).   Yes [provider]  cetirizine (ZYRTEC) 10 MG chewable tablet Chew 10 mg by mouth 2 (two) times a week.   Yes [provider]  ibuprofen (ADVIL) 800 MG tablet Take 1 tablet (800 mg total) by mouth every 8 (eight) hours as needed for mild pain or moderate pain. 10/24/19  Yes Eustace Moore, MD  lidocaine (XYLOCAINE) 2 % solution Use as directed 15 mLs in the mouth or throat as needed for mouth pain. 04/15/21  Yes Shy Guallpa R, NP   tizanidine (ZANAFLEX) 2 MG capsule Take 1 capsule (2 mg total) by mouth 3 (three) times daily. 11/30/20  Yes Rhys Martini, PA-C    Family History Family History  Problem Relation Age of Onset   Heart disease Father     Social History Social History   Tobacco Use   Smoking status: Former    Packs/day: 0.25    Pack years: 0.00    Types: Cigarettes   Smokeless tobacco: Never  Substance Use Topics   Alcohol use: No   Drug use: No     Allergies   Codeine   Review of Systems Review of Systems  Constitutional: Negative.   HENT:  Positive for dental problem and facial swelling. Negative for congestion, drooling, ear discharge, ear pain, hearing loss, mouth sores, nosebleeds, postnasal drip, rhinorrhea, sinus pressure, sinus pain, sneezing, sore throat, tinnitus, trouble swallowing and voice change.   Respiratory: Negative.    Cardiovascular: Negative.   Skin: Negative.   Neurological: Negative.     Physical Exam Triage Vital Signs ED Triage Vitals  Enc Vitals Group     BP 04/15/21 1357 (!) 150/94     Pulse Rate 04/15/21 1357 64     Resp 04/15/21 1357 18     Temp 04/15/21 1357 98.3 F (36.8 C)     Temp Source 04/15/21 1357 Oral  SpO2 04/15/21 1357 100 %     Weight --      Height --      Head Circumference --      Peak Flow --      Pain Score 04/15/21 1355 10     Pain Loc --      Pain Edu? --      Excl. in GC? --    No data found.  Updated Vital Signs BP (!) 150/94 (BP Location: Right Arm)   Pulse 64   Temp 98.3 F (36.8 C) (Oral)   Resp 18   SpO2 100%   Visual Acuity Right Eye Distance:   Left Eye Distance:   Bilateral Distance:    Right Eye Near:   Left Eye Near:    Bilateral Near:     Physical Exam Constitutional:      Appearance: Normal appearance. He is normal weight.  HENT:     Head: Normocephalic.     Mouth/Throat:     Comments: Poor dentition with dental decay,  decay and carie with moderate to severe gingival swelling around  left upper 1st molar, surrounding upper teeth on left side missing, swelling and tenderness of left check  Eyes:     Extraocular Movements: Extraocular movements intact.  Pulmonary:     Effort: Pulmonary effort is normal.  Musculoskeletal:     Cervical back: Normal range of motion and neck supple.  Neurological:     Mental Status: He is alert and oriented to person, place, and time. Mental status is at baseline.  Psychiatric:        Behavior: Behavior normal.     UC Treatments / Results  Labs (all labs ordered are listed, but only abnormal results are displayed) Labs Reviewed - No data to display  EKG   Radiology No results found.  Procedures Procedures (including critical care time)  Medications Ordered in UC Medications  ketorolac (TORADOL) 30 MG/ML injection 30 mg (has no administration in time range)  lidocaine (XYLOCAINE) 2 % viscous mouth solution 15 mL (has no administration in time range)    Initial Impression / Assessment and Plan / UC Course  I have reviewed the triage vital signs and the nursing notes.  Pertinent labs & imaging results that were available during my care of the patient were reviewed by me and considered in my medical decision making (see chart for details).  Dental pain  Toradol 30 mg IM now Viscous lidocaine 15 mL now Given resources for local low cost dental resources Amoxicillin 500 mg tid for 7 days Lidocaine viscous 2% 15 mLs every 4 hours prn Otc pain medications for management  Final Clinical Impressions(s) / UC Diagnoses   Final diagnoses:  Pain, dental     Discharge Instructions      Take antibiotic three times a day for 7 days  Can use 15 ml of mouth solution every 4 hours for pain, swish and spit  Can take two aleve pills every morning and every evening to help with pain   Try to follow up with a dentist for evaluation of the tooth    ED Prescriptions     Medication Sig Dispense Auth. Provider   amoxicillin  (AMOXIL) 500 MG capsule Take 1 capsule (500 mg total) by mouth 3 (three) times daily. 21 capsule Patty Leitzke R, NP   lidocaine (XYLOCAINE) 2 % solution Use as directed 15 mLs in the mouth or throat as needed for mouth pain. 100 mL Suleyma Wafer,  Elita Boone, NP      PDMP not reviewed this encounter.   Valinda Hoar, NP 04/15/21 1443

## 2021-04-22 ENCOUNTER — Ambulatory Visit (HOSPITAL_COMMUNITY)
Admission: EM | Admit: 2021-04-22 | Discharge: 2021-04-22 | Disposition: A | Discharge status: Other Acute Inpatient Hospital | Payer: Self-pay

## 2021-04-22 ENCOUNTER — Emergency Department (HOSPITAL_COMMUNITY): Payer: Self-pay

## 2021-04-22 ENCOUNTER — Observation Stay (HOSPITAL_COMMUNITY)
Admission: EM | Admit: 2021-04-22 | Discharge: 2021-04-23 | Disposition: A | Discharge status: Home / Self Care | Payer: Self-pay | Attending: General Surgery | Admitting: General Surgery

## 2021-04-22 ENCOUNTER — Other Ambulatory Visit: Payer: Self-pay

## 2021-04-22 ENCOUNTER — Encounter (HOSPITAL_COMMUNITY): Payer: Self-pay | Admitting: *Deleted

## 2021-04-22 ENCOUNTER — Encounter (HOSPITAL_COMMUNITY): Payer: Self-pay

## 2021-04-22 DIAGNOSIS — W109XXA Fall (on) (from) unspecified stairs and steps, initial encounter: Secondary | ICD-10-CM | POA: Insufficient documentation

## 2021-04-22 DIAGNOSIS — W19XXXA Unspecified fall, initial encounter: Secondary | ICD-10-CM

## 2021-04-22 DIAGNOSIS — R0602 Shortness of breath: Secondary | ICD-10-CM

## 2021-04-22 DIAGNOSIS — R109 Unspecified abdominal pain: Secondary | ICD-10-CM | POA: Insufficient documentation

## 2021-04-22 DIAGNOSIS — Y900 Blood alcohol level of less than 20 mg/100 ml: Secondary | ICD-10-CM | POA: Insufficient documentation

## 2021-04-22 DIAGNOSIS — S2232XA Fracture of one rib, left side, initial encounter for closed fracture: Secondary | ICD-10-CM | POA: Diagnosis present

## 2021-04-22 DIAGNOSIS — Z20822 Contact with and (suspected) exposure to covid-19: Secondary | ICD-10-CM | POA: Insufficient documentation

## 2021-04-22 DIAGNOSIS — S199XXA Unspecified injury of neck, initial encounter: Secondary | ICD-10-CM | POA: Insufficient documentation

## 2021-04-22 DIAGNOSIS — R0781 Pleurodynia: Secondary | ICD-10-CM

## 2021-04-22 DIAGNOSIS — Z87891 Personal history of nicotine dependence: Secondary | ICD-10-CM | POA: Insufficient documentation

## 2021-04-22 DIAGNOSIS — Z7982 Long term (current) use of aspirin: Secondary | ICD-10-CM | POA: Insufficient documentation

## 2021-04-22 DIAGNOSIS — Y92009 Unspecified place in unspecified non-institutional (private) residence as the place of occurrence of the external cause: Secondary | ICD-10-CM | POA: Insufficient documentation

## 2021-04-22 DIAGNOSIS — R519 Headache, unspecified: Secondary | ICD-10-CM | POA: Insufficient documentation

## 2021-04-22 DIAGNOSIS — S2242XA Multiple fractures of ribs, left side, initial encounter for closed fracture: Principal | ICD-10-CM | POA: Insufficient documentation

## 2021-04-22 LAB — COMPREHENSIVE METABOLIC PANEL
ALT: 12 U/L (ref 0–44)
AST: 20 U/L (ref 15–41)
Albumin: 4.1 g/dL (ref 3.5–5.0)
Alkaline Phosphatase: 44 U/L (ref 38–126)
Anion gap: 7 (ref 5–15)
BUN: 13 mg/dL (ref 8–23)
CO2: 26 mmol/L (ref 22–32)
Calcium: 9.1 mg/dL (ref 8.9–10.3)
Chloride: 106 mmol/L (ref 98–111)
Creatinine, Ser: 0.97 mg/dL (ref 0.61–1.24)
GFR, Estimated: >60 mL/min (ref >60)
Glucose, Bld: 104 mg/dL — ABNORMAL HIGH (ref 70–99)
Potassium: 4.1 mmol/L (ref 3.5–5.1)
Sodium: 139 mmol/L (ref 135–145)
Total Bilirubin: 1.2 mg/dL (ref 0.3–1.2)
Total Protein: 6.7 g/dL (ref 6.5–8.1)

## 2021-04-22 LAB — I-STAT CHEM 8, ED
BUN: 15 mg/dL (ref 8–23)
Calcium, Ion: 1.18 mmol/L (ref 1.15–1.40)
Chloride: 106 mmol/L (ref 98–111)
Creatinine, Ser: 0.9 mg/dL (ref 0.61–1.24)
Glucose, Bld: 99 mg/dL (ref 70–99)
HCT: 42 % (ref 39.0–52.0)
Hemoglobin: 14.3 g/dL (ref 13.0–17.0)
Potassium: 4.2 mmol/L (ref 3.5–5.1)
Sodium: 141 mmol/L (ref 135–145)
TCO2: 25 mmol/L (ref 22–32)

## 2021-04-22 LAB — URINALYSIS, ROUTINE W REFLEX MICROSCOPIC
Bilirubin Urine: NEGATIVE
Glucose, UA: NEGATIVE mg/dL
Ketones, ur: NEGATIVE mg/dL
Leukocytes,Ua: NEGATIVE
Nitrite: NEGATIVE
Protein, ur: NEGATIVE mg/dL
Specific Gravity, Urine: 1.01 (ref 1.005–1.030)
pH: 6 (ref 5.0–8.0)

## 2021-04-22 LAB — CBC
HCT: 41.8 % (ref 39.0–52.0)
Hemoglobin: 14.2 g/dL (ref 13.0–17.0)
MCH: 32.3 pg (ref 26.0–34.0)
MCHC: 34 g/dL (ref 30.0–36.0)
MCV: 95 fL (ref 80.0–100.0)
Platelets: 326 10*3/uL (ref 150–400)
RBC: 4.4 MIL/uL (ref 4.22–5.81)
RDW: 13 % (ref 11.5–15.5)
WBC: 8.2 10*3/uL (ref 4.0–10.5)
nRBC: 0 % (ref 0.0–0.2)

## 2021-04-22 LAB — URINALYSIS, MICROSCOPIC (REFLEX)
Bacteria, UA: NONE SEEN
Squamous Epithelial / HPF: NONE SEEN (ref 0–5)

## 2021-04-22 LAB — HIV ANTIBODY (ROUTINE TESTING W REFLEX): HIV Screen 4th Generation wRfx: NONREACTIVE

## 2021-04-22 LAB — RESP PANEL BY RT-PCR (FLU A&B, COVID) ARPGX2
Influenza A by PCR: NEGATIVE
Influenza B by PCR: NEGATIVE
SARS Coronavirus 2 by RT PCR: NEGATIVE

## 2021-04-22 LAB — PROTIME-INR
INR: 1 (ref 0.8–1.2)
Prothrombin Time: 13.1 seconds (ref 11.4–15.2)

## 2021-04-22 LAB — SAMPLE TO BLOOD BANK

## 2021-04-22 LAB — ETHANOL: Alcohol, Ethyl (B): <10 mg/dL (ref <10)

## 2021-04-22 LAB — LACTIC ACID, PLASMA: Lactic Acid, Venous: 0.9 mmol/L (ref 0.5–1.9)

## 2021-04-22 MED ORDER — MORPHINE SULFATE (PF) 4 MG/ML IV SOLN
4.0000 mg | Freq: Once | INTRAVENOUS | Status: AC
  Administered 2021-04-22: 4 mg via INTRAVENOUS
  Filled 2021-04-22: qty 1

## 2021-04-22 MED ORDER — ONDANSETRON HCL 4 MG/2ML IJ SOLN
4.0000 mg | Freq: Once | INTRAMUSCULAR | Status: AC
  Administered 2021-04-22: 4 mg via INTRAVENOUS
  Filled 2021-04-22: qty 2

## 2021-04-22 MED ORDER — KETOROLAC TROMETHAMINE 30 MG/ML IJ SOLN
30.0000 mg | Freq: Three times a day (TID) | INTRAMUSCULAR | Status: DC
  Administered 2021-04-22: 30 mg via INTRAVENOUS
  Filled 2021-04-22 (×2): qty 1

## 2021-04-22 MED ORDER — HYDROMORPHONE HCL 1 MG/ML IJ SOLN
1.0000 mg | Freq: Once | INTRAMUSCULAR | Status: AC
  Administered 2021-04-22: 1 mg via INTRAVENOUS
  Filled 2021-04-22: qty 1

## 2021-04-22 MED ORDER — ACETAMINOPHEN 500 MG PO TABS
1000.0000 mg | ORAL_TABLET | Freq: Four times a day (QID) | ORAL | Status: DC
  Administered 2021-04-22 – 2021-04-23 (×3): 1000 mg via ORAL
  Filled 2021-04-22 (×3): qty 2

## 2021-04-22 MED ORDER — SODIUM CHLORIDE 0.9 % IV SOLN: INTRAVENOUS | Status: DC

## 2021-04-22 MED ORDER — SODIUM CHLORIDE 0.9 % IV SOLN
12.5000 mg | Freq: Once | INTRAVENOUS | Status: DC
  Filled 2021-04-22: qty 0.5

## 2021-04-22 MED ORDER — METHOCARBAMOL 750 MG PO TABS
750.0000 mg | ORAL_TABLET | Freq: Three times a day (TID) | ORAL | Status: DC
  Administered 2021-04-22: 750 mg via ORAL
  Filled 2021-04-22: qty 1
  Filled 2021-04-22: qty 2

## 2021-04-22 MED ORDER — HYDROMORPHONE HCL 1 MG/ML IJ SOLN
0.5000 mg | INTRAMUSCULAR | Status: DC | PRN
Start: 2021-04-22 — End: 2021-04-23

## 2021-04-22 MED ORDER — HYDROMORPHONE HCL 1 MG/ML IJ SOLN
1.0000 mg | Freq: Once | INTRAMUSCULAR | Status: DC
  Filled 2021-04-22: qty 1

## 2021-04-22 MED ORDER — IOHEXOL 300 MG/ML  SOLN
100.0000 mL | Freq: Once | INTRAMUSCULAR | Status: AC | PRN
  Administered 2021-04-22: 100 mL via INTRAVENOUS

## 2021-04-22 MED ORDER — ONDANSETRON 4 MG PO TBDP: 4.0000 mg | ORAL_TABLET | Freq: Four times a day (QID) | ORAL | Status: DC | PRN

## 2021-04-22 MED ORDER — ENOXAPARIN SODIUM 30 MG/0.3ML IJ SOSY
30.0000 mg | PREFILLED_SYRINGE | Freq: Two times a day (BID) | INTRAMUSCULAR | Status: DC
  Administered 2021-04-22: 30 mg via SUBCUTANEOUS
  Filled 2021-04-22 (×2): qty 0.3

## 2021-04-22 MED ORDER — SODIUM CHLORIDE 0.9 % IV SOLN: 6.2500 mg | Freq: Four times a day (QID) | INTRAVENOUS | Status: DC | PRN

## 2021-04-22 MED ORDER — PROCHLORPERAZINE EDISYLATE 10 MG/2ML IJ SOLN: 10.0000 mg | Freq: Four times a day (QID) | INTRAMUSCULAR | Status: DC | PRN

## 2021-04-22 MED ORDER — DOCUSATE SODIUM 100 MG PO CAPS
100.0000 mg | ORAL_CAPSULE | Freq: Two times a day (BID) | ORAL | Status: DC
  Administered 2021-04-22: 100 mg via ORAL
  Filled 2021-04-22 (×2): qty 1

## 2021-04-22 MED ORDER — METOPROLOL TARTRATE 5 MG/5ML IV SOLN: 5.0000 mg | Freq: Four times a day (QID) | INTRAVENOUS | Status: DC | PRN

## 2021-04-22 MED ORDER — ONDANSETRON HCL 4 MG/2ML IJ SOLN
4.0000 mg | Freq: Four times a day (QID) | INTRAMUSCULAR | Status: DC | PRN
  Filled 2021-04-22: qty 2

## 2021-04-22 MED ORDER — SODIUM CHLORIDE 0.9 % IV BOLUS
1000.0000 mL | Freq: Once | INTRAVENOUS | Status: AC
Start: 2021-04-22 — End: 2021-04-22
  Administered 2021-04-22: 1000 mL via INTRAVENOUS

## 2021-04-22 MED ORDER — TRAMADOL HCL 50 MG PO TABS: 50.0000 mg | ORAL_TABLET | Freq: Four times a day (QID) | ORAL | Status: DC | PRN

## 2021-04-22 NOTE — ED Notes (Signed)
Leonard Morn MD at bedside with patient.

## 2021-04-22 NOTE — ED Notes (Signed)
Attempted report x 2 

## 2021-04-22 NOTE — Progress Notes (Signed)
Patient arrived to [facility] at 1630 accompanied by Florentina Addison, RN. Bedside handoff completed. Patient reports he is nauseous at the moment, Florentina Addison stated she already gave him zofran before arrival. Patient is very sensitive to narcotics and it is believed that the narcotics are adding to his nausea. Plan is to adequately get pain and nausea under control for a potential discharge tomorrow.

## 2021-04-22 NOTE — ED Provider Notes (Signed)
Patient here for evaluation after falling down approximately 8 steps this am.  Reports hitting the back of his head and may have lost consciousness.  Denies any headache but does report some nausea and leg numbness.  Also reports having some rib and side pain with shortness of breath.  Denies any vision changes.  Recommend that patient be transported to the ED for further evaluation at a higher level of care for possible concussion or intracranial abnormality.  Patient does not have a ride and will be transported via EMS.    Ivette Loyal, NP 04/22/21 1016

## 2021-04-22 NOTE — ED Notes (Signed)
Patient transported to CT 

## 2021-04-22 NOTE — ED Triage Notes (Signed)
Pt also reports fall with LOC at 0720.

## 2021-04-22 NOTE — H&P (Signed)
Leonard Rowe Katrinka Blazing Jul 06, 1956  [id number].    Chief Complaint/Reason for Consult: Fall with rib fractures x2  HPI:  This is a 65 yo healthy white male who was at home this morning and slipped and fell down some steps at home because they were wet.  He does not have a fall history.  He denies syncope or any other cause of his fall, except mechanical.  He states that he hit his posterior head or the top of his neck and thinks he may have had a short LOC.  He hit his left side as well as continues to have left posterior and lateral chest pain.  He initially had some pain in his neck and down his left shoulder but this has resolved since being here.  He denies any other pain.  He is currently struggling with pain control as well as nausea secondary to narcotics.  He has the "shakes" upon my entering secondary to medications and pain as he just stood to void.  He underwent trauma scans and his only injuries are L 10-11 rib fractures.  We have been asked to see him for admission for pain control.  He is sating 100% on RA.  ROS: ROS: Please see HPI, otherwise all other systems have been reviewed and are negative.  Family History  Problem Relation Age of Onset   Heart disease Father     History reviewed. No pertinent past medical history.  Past Surgical History:  Procedure Laterality Date   APPENDECTOMY     CHOLECYSTECTOMY     I & D EXTREMITY Right 04/17/2017   Procedure: IRRIGATION AND DEBRIDEMENT EXTREMITY RIGHT MIDDLE FINGER;  Surgeon: Dairl Ponder, MD;  Location: [facility] OR;  Service: Orthopedics;  Laterality: Right;   KNEE SURGERY     ROTATOR CUFF REPAIR      Social History:  reports that he has been smoking cigarettes. He has been smoking an average of .25 packs per day. He has never used smokeless tobacco. He reports that he does not drink alcohol and does not use drugs.  Allergies:  Allergies  Allergen Reactions   Codeine Hives and Itching    (Not in a hospital  admission)    Physical Exam: Blood pressure (!) 162/86, pulse (!) 59, temperature 97.8 F (36.6 C), temperature source Oral, resp. rate 13, height 6' (1.829 m), weight 72.6 kg, SpO2 99 %. General: pleasant, WD, WN white male who is laying in bed in mild distress secondary to pain and nausea. HEENT: head is normocephalic, atraumatic.  Sclera are noninjected.  PERRL.  Ears and nose without any masses or lesions.  Mouth is pink and moist Heart: regular, rate, and rhythm.  Normal s1,s2. No obvious murmurs, gallops, or rubs noted.  Palpable radial and pedal pulses bilaterally Lungs: CTAB, no wheezes, rhonchi, or rales noted.  Respiratory effort nonlabored.  100% O2 on RA.  Left lateral and posterior chest wall pain with palpation.  No ecchymosis identified. Abd: soft, NT, ND, +BS, no masses or organomegaly.  Reducible miniscule umbilical hernia noted. MS: all 4 extremities are symmetrical with no cyanosis, clubbing, or edema. Skin: warm and dry with no masses, lesions, or rashes Neuro: Cranial nerves 2-12 grossly intact, sensation is normal throughout Psych: A&Ox3 with an appropriate affect.   Results for orders placed or performed during the hospital encounter of 04/22/21 (from the past 48 hour(s))  Comprehensive metabolic panel     Status: Abnormal   Collection Time: 04/22/21 10:44 AM  Result Value Ref Range   Sodium 139 135 - 145 mmol/L   Potassium 4.1 3.5 - 5.1 mmol/L   Chloride 106 98 - 111 mmol/L   CO2 26 22 - 32 mmol/L   Glucose, Bld 104 (H) 70 - 99 mg/dL    Comment: Glucose reference range applies only to samples taken after fasting for at least 8 hours.   BUN 13 8 - 23 mg/dL   Creatinine, Ser 8.65 0.61 - 1.24 mg/dL   Calcium 9.1 8.9 - 78.4 mg/dL   Total Protein 6.7 6.5 - 8.1 g/dL   Albumin 4.1 3.5 - 5.0 g/dL   AST 20 15 - 41 U/L   ALT 12 0 - 44 U/L   Alkaline Phosphatase 44 38 - 126 U/L   Total Bilirubin 1.2 0.3 - 1.2 mg/dL   GFR, Estimated >69 >62 mL/min    Comment:  (NOTE) Calculated using the CKD-EPI Creatinine Equation (2021)    Anion gap 7 5 - 15    Comment: Performed at [facility], [address] [place], [place] [address]  CBC     Status: None   Collection Time: 04/22/21 10:44 AM  Result Value Ref Range   WBC 8.2 4.0 - 10.5 K/uL   RBC 4.40 4.22 - 5.81 MIL/uL   Hemoglobin 14.2 13.0 - 17.0 g/dL   HCT 13.2 44.0 - 10.2 %   MCV 95.0 80.0 - 100.0 fL   MCH 32.3 26.0 - 34.0 pg   MCHC 34.0 30.0 - 36.0 g/dL   RDW 72.5 36.6 - 44.0 %   Platelets 326 150 - 400 K/uL   nRBC 0.0 0.0 - 0.2 %    Comment: Performed at [facility], [address], [place], [place] [address]  Ethanol     Status: None   Collection Time: 04/22/21 10:44 AM  Result Value Ref Range   Alcohol, Ethyl (B) <10 <10 mg/dL    Comment: (NOTE) Lowest detectable limit for serum alcohol is 10 mg/dL.  For medical purposes only. Performed at [facility], [address], [place], [place] [address]   Urinalysis, Routine Rowe reflex microscopic Urine, Clean Catch     Status: Abnormal   Collection Time: 04/22/21 10:44 AM  Result Value Ref Range   Color, Urine YELLOW YELLOW   APPearance CLEAR CLEAR   Specific Gravity, Urine 1.010 1.005 - 1.030   pH 6.0 5.0 - 8.0   Glucose, UA NEGATIVE NEGATIVE mg/dL   Hgb urine dipstick TRACE (A) NEGATIVE   Bilirubin Urine NEGATIVE NEGATIVE   Ketones, ur NEGATIVE NEGATIVE mg/dL   Protein, ur NEGATIVE NEGATIVE mg/dL   Nitrite NEGATIVE NEGATIVE   Leukocytes,Ua NEGATIVE NEGATIVE    Comment: Performed at [facility], [address] [place], [place] [address]  Lactic acid, plasma     Status: None   Collection Time: 04/22/21 10:44 AM  Result Value Ref Range   Lactic Acid, Venous 0.9 0.5 - 1.9 mmol/L    Comment: Performed at [facility], [address] [place], [place] [address]  Protime-INR     Status: None   Collection Time: 04/22/21 10:44 AM  Result Value Ref Range   Prothrombin Time 13.1 11.4 - 15.2 seconds   INR  1.0 0.8 - 1.2    Comment: (NOTE) INR goal varies based on device and disease states. Performed at [facility], [address] [place], [place] [address]   Urinalysis, Microscopic (reflex)  Status: None   Collection Time: 04/22/21 10:44 AM  Result Value Ref Range   RBC / HPF 0-5 0 - 5 RBC/hpf   WBC, UA 0-5 0 - 5 WBC/hpf   Bacteria, UA NONE SEEN NONE SEEN   Squamous Epithelial / LPF NONE SEEN 0 - 5    Comment: Performed at [facility], [address], [place], [place] [address]  Sample to Blood Bank     Status: None   Collection Time: 04/22/21 10:54 AM  Result Value Ref Range   Blood Bank Specimen SAMPLE AVAILABLE FOR TESTING    Sample Expiration      04/23/2021,2359 Performed at [facility], [address], [place], [place] [address]   I-Stat Chem 8, ED     Status: None   Collection Time: 04/22/21 11:03 AM  Result Value Ref Range   Sodium 141 135 - 145 mmol/L   Potassium 4.2 3.5 - 5.1 mmol/L   Chloride 106 98 - 111 mmol/L   BUN 15 8 - 23 mg/dL   Creatinine, Ser 1.19 0.61 - 1.24 mg/dL   Glucose, Bld 99 70 - 99 mg/dL    Comment: Glucose reference range applies only to samples taken after fasting for at least 8 hours.   Calcium, Ion 1.18 1.15 - 1.40 mmol/L   TCO2 25 22 - 32 mmol/L   Hemoglobin 14.3 13.0 - 17.0 g/dL   HCT 14.7 82.9 - 56.2 %  Resp Panel by RT-PCR (Flu A&B, Covid) Nasopharyngeal Swab     Status: None   Collection Time: 04/22/21 11:12 AM   Specimen: Nasopharyngeal Swab; Nasopharyngeal(NP) swabs in vial transport medium  Result Value Ref Range   SARS Coronavirus 2 by RT PCR NEGATIVE NEGATIVE    Comment: (NOTE) SARS-CoV-2 target nucleic acids are NOT DETECTED.  The SARS-CoV-2 RNA is generally detectable in upper respiratory specimens during the acute phase of infection. The lowest concentration of SARS-CoV-2 viral copies this assay can detect is 138 copies/mL. A negative result does not preclude SARS-Cov-2 infection and should not be  used as the sole basis for treatment or other patient management decisions. A negative result may occur with  improper specimen collection/handling, submission of specimen other than nasopharyngeal swab, presence of viral mutation(s) within the areas targeted by this assay, and inadequate number of viral copies(<138 copies/mL). A negative result must be combined with clinical observations, patient history, and epidemiological information. The expected result is Negative.  Fact Sheet for Patients:  BloggerCourse.com  Fact Sheet for Healthcare Providers:  SeriousBroker.it  This test is no t yet approved or cleared by the [place] FDA and  has been authorized for detection and/or diagnosis of SARS-CoV-2 by FDA under an Emergency Use Authorization (EUA). This EUA will remain  in effect (meaning this test can be used) for the duration of the COVID-19 declaration under Section 564(b)(1) of the Act, 21 U.S.C.section 360bbb-3(b)(1), unless the authorization is terminated  or revoked sooner.       Influenza A by PCR NEGATIVE NEGATIVE   Influenza B by PCR NEGATIVE NEGATIVE    Comment: (NOTE) The Xpert Xpress SARS-CoV-2/FLU/RSV plus assay is intended as an aid in the diagnosis of influenza from Nasopharyngeal swab specimens and should not be used as a sole basis for treatment. Nasal washings and aspirates are unacceptable for Xpert Xpress SARS-CoV-2/FLU/RSV testing.  Fact Sheet for Patients: BloggerCourse.com  Fact Sheet for Healthcare Providers: SeriousBroker.it  This test is not yet approved or cleared by the  [place] [employer] FDA and has been authorized for detection and/or diagnosis of SARS-CoV-2 by FDA under an TEFL teachermergency Use Authorization (EUA). This EUA will remain in effect (meaning this test can be used) for the duration of the COVID-19 declaration under Section 564(b)(1) of the  Act, 21 U.S.C. section 360bbb-3(b)(1), unless the authorization is terminated or revoked.  Performed at [facility], [address] [place], [place] [id number]    CT HEAD WO CONTRAST  Result Date: 04/22/2021 CLINICAL DATA:  Trauma.  Fall down steps. EXAM: CT HEAD WITHOUT CONTRAST CT CERVICAL SPINE WITHOUT CONTRAST TECHNIQUE: Multidetector CT imaging of the head and cervical spine was performed following the standard protocol without intravenous contrast. Multiplanar CT image reconstructions of the cervical spine were also generated. COMPARISON:  09/21/2013. FINDINGS: CT HEAD FINDINGS Brain: No evidence of acute large vascular territory infarction, hemorrhage, hydrocephalus, extra-axial collection or mass lesion/mass effect. Partially empty sella. Fall Vascular: No hyperdense vessel identified. Skull: No acute fracture. Sinuses/Orbits: Mucosal thickening of bilateral maxillary sinuses, scattered ethmoid air cells and right frontal sinus. Other: Partially imaged periapical lucency of a left maxillary molar. 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 The CT CERVICAL SPINE FINDINGS Alignment: Approximately 1-2 mm of anterolisthesis of C3 on C4, which is new from the 2014 prior but favored degenerative given severe left facet arthropathy at this level. Skull base and vertebrae: No evidence of acute fracture. Vertebral body heights are maintained. Soft tissues and spinal canal: No prevertebral fluid or swelling. No visible canal hematoma. Disc levels: Severe focal left facet arthropathy at C3-C4. Mild multilevel degenerative disc disease with calcifications of the intervertebral discs. Upper chest: Biapical pleuroparenchymal scarring. Otherwise, visualized lung apices are clear. IMPRESSION: CT head: 1. No evidence of acute intracranial abnormality. 2. Mild-to-moderate paranasal sinus mucosal  thickening, as detailed above. 3. 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 This may relate to interval rupture and/or surgical intervention. CT cervical spine: 1. No evidence of acute fracture. 2. Approximately 1-2 mm of anterolisthesis of C3 on C4, which is new from the 2014 prior but favored degenerative given new severe left facet arthropathy at this level. Electronically Signed   By: Feliberto HartsFrederick S Jones MD   On: 04/22/2021 13:47   CT CERVICAL SPINE WO CONTRAST  Result Date: 04/22/2021 CLINICAL DATA:  Trauma.  Fall down steps. EXAM: CT HEAD WITHOUT CONTRAST CT CERVICAL SPINE WITHOUT CONTRAST TECHNIQUE: Multidetector CT imaging of the head and cervical spine was performed following the standard protocol without intravenous contrast. Multiplanar CT image reconstructions of the cervical spine were also generated. COMPARISON:  09/21/2013. FINDINGS: CT HEAD FINDINGS Brain: No evidence of acute large vascular territory infarction, hemorrhage, hydrocephalus, extra-axial collection or mass lesion/mass effect. Partially empty sella. Fall Vascular: No hyperdense vessel identified. Skull: No acute fracture. Sinuses/Orbits: Mucosal thickening of bilateral maxillary sinuses, scattered ethmoid air cells and right frontal sinus. Other: Partially imaged periapical lucency of a left maxillary molar. 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 The CT CERVICAL SPINE FINDINGS Alignment: Approximately 1-2 mm of anterolisthesis of C3 on C4, which is new from the 2014 prior but favored degenerative given severe left facet arthropathy at this level. Skull base and  vertebrae: No evidence of acute fracture. Vertebral body heights are maintained. Soft tissues and spinal canal: No prevertebral fluid or  swelling. No visible canal hematoma. Disc levels: Severe focal left facet arthropathy at C3-C4. Mild multilevel degenerative disc disease with calcifications of the intervertebral discs. Upper chest: Biapical pleuroparenchymal scarring. Otherwise, visualized lung apices are clear. IMPRESSION: CT head: 1. No evidence of acute intracranial abnormality. 2. Mild-to-moderate paranasal sinus mucosal thickening, as detailed above. 3. 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 This may relate to interval rupture and/or surgical intervention. CT cervical spine: 1. No evidence of acute fracture. 2. Approximately 1-2 mm of anterolisthesis of C3 on C4, which is new from the 2014 prior but favored degenerative given new severe left facet arthropathy at this level. Electronically Signed   By: Feliberto Harts MD   On: 04/22/2021 13:47   DG Pelvis Portable  Result Date: 04/22/2021 CLINICAL DATA:  Status post fall with loss of consciousness. EXAM: PORTABLE PELVIS 1-2 VIEWS COMPARISON:  None. FINDINGS: Moderate joint space narrowing, subchondral sclerosis and marginal spur formation is noted bilaterally. No acute fracture or dislocation identified. No radiopaque foreign bodies are soft tissue calcifications. IMPRESSION: 1. No acute findings. 2. Moderate bilateral hip osteoarthritis. Electronically Signed   By: Signa Kell M.D.   On: 04/22/2021 11:41   CT CHEST ABDOMEN PELVIS Rowe CONTRAST  Result Date: 04/22/2021 CLINICAL DATA:  Trauma. Patient fell down 7 steps. Complains of left flank pain. EXAM: CT CHEST, ABDOMEN, AND PELVIS WITH CONTRAST TECHNIQUE: Multidetector CT imaging of the chest, abdomen and pelvis was performed following the standard protocol during bolus administration of intravenous contrast. CONTRAST:  OMNIPAQUE IOHEXOL 300 MG/ML  SOLN COMPARISON:  CT chest 09/21/2013 and CT AP  07/21/2014. FINDINGS: CT CHEST FINDINGS Cardiovascular: Mild cardiac enlargement. No pericardial effusion. A few scattered atherosclerotic calcifications noted within the aorta. Distal lad coronary artery calcification. Mediastinum/Nodes: Normal appearance of the thyroid gland. The trachea appears patent and is midline. Normal appearance of the esophagus. No enlarged lymph nodes. Lungs/Pleura: Mild centrilobular and paraseptal emphysema. No pleural effusion. No airspace consolidation, atelectasis, or pneumothorax. Scar noted within the posteromedial left lung base. Musculoskeletal: Acute left posterior tenth and eleventh rib fractures the thoracic vertebral body heights and sternum appear intact. CT ABDOMEN PELVIS FINDINGS Hepatobiliary: There are a few small low-attenuation liver lesions which are unchanged from 2015 and likely represent small cysts. Previous cholecystectomy. Mild intrahepatic bile duct dilatation. Increase caliber of the common bile duct measures 1 cm, image 32/8. No common bile duct stone or mass identified. Pancreas: Unremarkable. No pancreatic ductal dilatation or surrounding inflammatory changes. Spleen: Normal in size without focal abnormality. Adrenals/Urinary Tract: Normal adrenal glands. No hydronephrosis or nephrolithiasis. No signs of kidney injury. Exophytic cyst arising off the lateral cortex of the left kidney is unchanged measuring 3.5 cm, image 62/3. Bladder unremarkable. Stomach/Bowel: Stomach is normal. No bowel wall thickening, inflammation, or distension. Distal colonic diverticulosis. Vascular/Lymphatic: Aortic atherosclerosis. No aneurysm. No abdominopelvic adenopathy. Reproductive: Prostate is unremarkable. Other: Small bilateral fat containing inguinal hernias and small umbilical hernia which also contains fat only. No free fluid. Musculoskeletal: Lumbar vertebral body heights are well preserved. No acute or suspicious osseous findings IMPRESSION: 1. Acute left posterior  tenth and eleventh rib fractures. No pneumothorax. 2. No acute findings identified within the abdomen or pelvis. 3. Emphysema and aortic atherosclerosis. Coronary artery calcifications noted. Aortic  Atherosclerosis (ICD10-I70.0) and Emphysema (ICD10-J43.9). Electronically Signed   By: Signa Kell M.D.   On: 04/22/2021 13:40   DG Chest Port 1 View  Result Date: 04/22/2021 CLINICAL DATA:  Left neck and left flank pain. EXAM: PORTABLE CHEST 1 VIEW COMPARISON:  11/30/2020 FINDINGS: The heart size and mediastinal contours are within normal limits. Both lungs are clear. The visualized skeletal structures are unremarkable. IMPRESSION: No active disease. Electronically Signed   By: Signa Kell M.D.   On: 04/22/2021 11:39      Assessment/Plan Mechanical Fall  L 10,11 fib fx - pain control, pulm toilet, mobilization, IS Tobacco abuse  FEN - regular diet, anti-emetics, multi-modal pain VTE - lovenox BID ID - none needed Admit - observation to med-surg  [place]a [place], [facility] Surgery 04/22/2021, 2:54 PM Please see Amion for pager number during day hours 7:00am-4:30pm or 7:00am -11:30am on weekends

## 2021-04-22 NOTE — ED Notes (Signed)
Attempted report x1. 

## 2021-04-22 NOTE — ED Notes (Signed)
Patient ambulated at bedside using urinal. Reports feeling nauseous still. Patient diaphoretic. Trauma PA at bedside.

## 2021-04-22 NOTE — Progress Notes (Signed)
Pt refused most of his bedtime meds. Educated pt about each meds.

## 2021-04-22 NOTE — ED Triage Notes (Signed)
Pt via [facility] fell down 7 stairs with +LOC, having L neck and L flank pain. Sent here from [facility] for CT.

## 2021-04-22 NOTE — ED Triage Notes (Signed)
CN called report

## 2021-04-22 NOTE — ED Notes (Signed)
Incentive spirometer given to patient.

## 2021-04-22 NOTE — ED Notes (Signed)
[facility] called for transport 

## 2021-04-22 NOTE — ED Notes (Signed)
Report called to CN

## 2021-04-22 NOTE — ED Triage Notes (Signed)
Pt reports he fell off his deck this morning . The steps were wet. Pt reports steps were 8 steps high.

## 2021-04-22 NOTE — ED Notes (Signed)
Talk to pt upon check in; pt is in 10/10 pain and O2 is at 95-96%

## 2021-04-22 NOTE — ED Provider Notes (Signed)
[facility] EMERGENCY DEPARTMENT Provider Note   CSN: [id number] Arrival date & time: 04/22/21  1035     History No chief complaint on file.   Ahmari Lacretia Nicks Valliant is a 65 y.o. male.  Pt presents to the ED today with pain to his head, neck, left chest and abdomen after a fall down about 7 steps.  Pt said he slipped on his steps this am because they are wet.  He landed on his left side.  He is not sure if he had a loc or not, but thinks he did.  He said he got the "wind knocked out of him."  He said it hurts quite a bit to move.  [facility] and [facility] via care link.      No past medical history on file.  Patient Active Problem List   Diagnosis Date Noted   Insect bite 04/17/2017   Finger swelling     Past Surgical History:  Procedure Laterality Date   APPENDECTOMY     CHOLECYSTECTOMY     I & D EXTREMITY Right 04/17/2017   Procedure: IRRIGATION AND DEBRIDEMENT EXTREMITY RIGHT MIDDLE FINGER;  Surgeon: Dairl Ponder, MD;  Location: [facility] OR;  Service: Orthopedics;  Laterality: Right;   KNEE SURGERY     ROTATOR CUFF REPAIR         Family History  Problem Relation Age of Onset   Heart disease Father     Social History   Tobacco Use   Smoking status: Former    Packs/day: 0.25    Types: Cigarettes   Smokeless tobacco: Never  Substance Use Topics   Alcohol use: No   Drug use: No    Home Medications Prior to Admission medications   Medication Sig Start Date End Date Taking? Authorizing Provider  amoxicillin (AMOXIL) 500 MG capsule Take 1 capsule (500 mg total) by mouth 3 (three) times daily. 04/15/21  Yes White, Adrienne R, NP  Aspirin-Salicylamide-Caffeine (BC HEADACHE POWDER PO) Take 1 packet by mouth daily as needed (pain).   Yes [provider]  cetirizine (ZYRTEC) 10 MG chewable tablet Chew 10 mg by mouth 2 (two) times a week.   Yes [provider]  lidocaine (XYLOCAINE) 2 % solution Use as directed 15 mLs  in the mouth or throat as needed for mouth pain. 04/15/21  Yes White, Adrienne R, NP  ibuprofen (ADVIL) 800 MG tablet Take 1 tablet (800 mg total) by mouth every 8 (eight) hours as needed for mild pain or moderate pain. Patient not taking: Reported on 04/22/2021 10/24/19   Eustace Moore, MD  tizanidine (ZANAFLEX) 2 MG capsule Take 1 capsule (2 mg total) by mouth 3 (three) times daily. Patient not taking: Reported on 04/22/2021 11/30/20   Rhys Martini, PA-C    Allergies    Codeine  Review of Systems   Review of Systems  Gastrointestinal:  Positive for abdominal pain.  Musculoskeletal:  Positive for neck pain.       Left chest wall pain  Neurological:  Positive for headaches.  All other systems reviewed and are negative.  Physical Exam Updated Vital Signs BP (!) 162/86   Pulse (!) 59   Temp 97.8 F (36.6 C) (Oral)   Resp 13   Ht 6' (1.829 m)   Wt 72.6 kg   SpO2 99%   BMI 21.70 kg/m   Physical Exam Vitals and nursing note reviewed.  Constitutional:  Appearance: Normal appearance.  HENT:     Head: Normocephalic and atraumatic.     Right Ear: External ear normal.     Left Ear: External ear normal.     Nose: Nose normal.     Mouth/Throat:     Mouth: Mucous membranes are moist.     Pharynx: Oropharynx is clear.  Eyes:     Extraocular Movements: Extraocular movements intact.     Conjunctiva/sclera: Conjunctivae normal.     Pupils: Pupils are equal, round, and reactive to light.  Neck:     Comments: In c-collar Cardiovascular:     Rate and Rhythm: Normal rate and regular rhythm.     Pulses: Normal pulses.     Heart sounds: Normal heart sounds.  Pulmonary:     Effort: Pulmonary effort is normal.     Breath sounds: Normal breath sounds.  Chest:     Chest wall: Tenderness present. No lacerations, deformity or crepitus.     Comments: Left upper chest tenderness.  Abdominal:     General: Abdomen is flat. Bowel sounds are normal.     Palpations: Abdomen is soft.      Tenderness: There is abdominal tenderness in the left upper quadrant.  Musculoskeletal:        General: Normal range of motion.     Cervical back: Spinous process tenderness present.  Skin:    General: Skin is warm.     Capillary Refill: Capillary refill takes less than 2 seconds.  Neurological:     General: No focal deficit present.     Mental Status: He is alert and oriented to person, place, and time.  Psychiatric:        Mood and Affect: Mood normal.        Behavior: Behavior normal.        Thought Content: Thought content normal.        Judgment: Judgment normal.    ED Results / Procedures / Treatments   Labs (all labs ordered are listed, but only abnormal results are displayed) Labs Reviewed  COMPREHENSIVE METABOLIC PANEL - Abnormal; Notable for the following components:      Result Value   Glucose, Bld 104 (*)    All other components within normal limits  URINALYSIS, ROUTINE W REFLEX MICROSCOPIC - Abnormal; Notable for the following components:   Hgb urine dipstick TRACE (*)    All other components within normal limits  RESP PANEL BY RT-PCR (FLU A&B, COVID) ARPGX2  CBC  ETHANOL  LACTIC ACID, PLASMA  PROTIME-INR  URINALYSIS, MICROSCOPIC (REFLEX)  I-STAT CHEM 8, ED  SAMPLE TO BLOOD BANK    EKG EKG Interpretation  Date/Time:  Thursday April 22 2021 10:45:29 EDT Ventricular Rate:  53 PR Interval:  184 QRS Duration: 140 QT Interval:  408 QTC Calculation: 383 R Axis:   79 Text Interpretation: Sinus rhythm Right bundle branch block No significant change since last tracing Confirmed by Jacalyn LefevreHaviland, Kaliopi Blyden [id number]) on 04/22/2021 11:13:01 AM  Radiology CT HEAD WO CONTRAST  Result Date: 04/22/2021 CLINICAL DATA:  Trauma.  Fall down steps. EXAM: CT HEAD WITHOUT CONTRAST CT CERVICAL SPINE WITHOUT CONTRAST TECHNIQUE: Multidetector CT imaging of the head and cervical spine was performed following the standard protocol without intravenous contrast. Multiplanar CT image  reconstructions of the cervical spine were also generated. COMPARISON:  09/21/2013. FINDINGS: CT HEAD FINDINGS Brain: No evidence of acute large vascular territory infarction, hemorrhage, hydrocephalus, extra-axial collection or mass lesion/mass effect. Partially empty sella. Fall Vascular: No hyperdense  vessel identified. Skull: No acute fracture. Sinuses/Orbits: Mucosal thickening of bilateral maxillary sinuses, scattered ethmoid air cells and right frontal sinus. Other: Partially imaged periapical lucency of a left maxillary molar. 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 The CT CERVICAL SPINE FINDINGS Alignment: Approximately 1-2 mm of anterolisthesis of C3 on C4, which is new from the 2014 prior but favored degenerative given severe left facet arthropathy at this level. Skull base and vertebrae: No evidence of acute fracture. Vertebral body heights are maintained. Soft tissues and spinal canal: No prevertebral fluid or swelling. No visible canal hematoma. Disc levels: Severe focal left facet arthropathy at C3-C4. Mild multilevel degenerative disc disease with calcifications of the intervertebral discs. Upper chest: Biapical pleuroparenchymal scarring. Otherwise, visualized lung apices are clear. IMPRESSION: CT head: 1. No evidence of acute intracranial abnormality. 2. Mild-to-moderate paranasal sinus mucosal thickening, as detailed above. 3. 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 This may relate to interval rupture and/or surgical intervention. CT cervical spine: 1. No evidence of acute fracture. 2. Approximately 1-2 mm of anterolisthesis of C3 on C4, which is new from the 2014 prior but favored degenerative given new severe left facet arthropathy at this level.  Electronically Signed   By: Feliberto Harts MD   On: 04/22/2021 13:47   CT CERVICAL SPINE WO CONTRAST  Result Date: 04/22/2021 CLINICAL DATA:  Trauma.  Fall down steps. EXAM: CT HEAD WITHOUT CONTRAST CT CERVICAL SPINE WITHOUT CONTRAST TECHNIQUE: Multidetector CT imaging of the head and cervical spine was performed following the standard protocol without intravenous contrast. Multiplanar CT image reconstructions of the cervical spine were also generated. COMPARISON:  09/21/2013. FINDINGS: CT HEAD FINDINGS Brain: No evidence of acute large vascular territory infarction, hemorrhage, hydrocephalus, extra-axial collection or mass lesion/mass effect. Partially empty sella. Fall Vascular: No hyperdense vessel identified. Skull: No acute fracture. Sinuses/Orbits: Mucosal thickening of bilateral maxillary sinuses, scattered ethmoid air cells and right frontal sinus. Other: Partially imaged periapical lucency of a left maxillary molar. 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 The CT CERVICAL SPINE FINDINGS Alignment: Approximately 1-2 mm of anterolisthesis of C3 on C4, which is new from the 2014 prior but favored degenerative given severe left facet arthropathy at this level. Skull base and vertebrae: No evidence of acute fracture. Vertebral body heights are maintained. Soft tissues and spinal canal: No prevertebral fluid or swelling. No visible canal hematoma. Disc levels: Severe focal left facet arthropathy at C3-C4. Mild multilevel degenerative disc disease with calcifications of the intervertebral discs. Upper chest: Biapical pleuroparenchymal scarring. Otherwise, visualized lung apices are clear. IMPRESSION: CT head: 1. No evidence of acute intracranial abnormality. 2. Mild-to-moderate paranasal sinus mucosal thickening, as detailed above. 3. In the region of the previously seen Tornwaldt cyst in  the midline nasopharynx there is ill-defined fluid density lesion which appears smaller than the prior lesion. This may relate to interval rupture and/or surgical intervention. CT cervical spine: 1. No evidence of acute fracture. 2. Approximately 1-2 mm of anterolisthesis of C3 on C4, which is new from the 2014 prior but favored degenerative given new severe left facet arthropathy at this level. Electronically Signed   By: Feliberto Harts MD   On:  04/22/2021 13:47   DG Pelvis Portable  Result Date: 04/22/2021 CLINICAL DATA:  Status post fall with loss of consciousness. EXAM: PORTABLE PELVIS 1-2 VIEWS COMPARISON:  None. FINDINGS: Moderate joint space narrowing, subchondral sclerosis and marginal spur formation is noted bilaterally. No acute fracture or dislocation identified. No radiopaque foreign bodies are soft tissue calcifications. IMPRESSION: 1. No acute findings. 2. Moderate bilateral hip osteoarthritis. Electronically Signed   By: Signa Kell M.D.   On: 04/22/2021 11:41   CT CHEST ABDOMEN PELVIS W CONTRAST  Result Date: 04/22/2021 CLINICAL DATA:  Trauma. Patient fell down 7 steps. Complains of left flank pain. EXAM: CT CHEST, ABDOMEN, AND PELVIS WITH CONTRAST TECHNIQUE: Multidetector CT imaging of the chest, abdomen and pelvis was performed following the standard protocol during bolus administration of intravenous contrast. CONTRAST:  OMNIPAQUE IOHEXOL 300 MG/ML  SOLN COMPARISON:  CT chest 09/21/2013 and CT AP 07/21/2014. FINDINGS: CT CHEST FINDINGS Cardiovascular: Mild cardiac enlargement. No pericardial effusion. A few scattered atherosclerotic calcifications noted within the aorta. Distal lad coronary artery calcification. Mediastinum/Nodes: Normal appearance of the thyroid gland. The trachea appears patent and is midline. Normal appearance of the esophagus. No enlarged lymph nodes. Lungs/Pleura: Mild centrilobular and paraseptal emphysema. No pleural effusion. No airspace consolidation,  atelectasis, or pneumothorax. Scar noted within the posteromedial left lung base. Musculoskeletal: Acute left posterior tenth and eleventh rib fractures the thoracic vertebral body heights and sternum appear intact. CT ABDOMEN PELVIS FINDINGS Hepatobiliary: There are a few small low-attenuation liver lesions which are unchanged from 2015 and likely represent small cysts. Previous cholecystectomy. Mild intrahepatic bile duct dilatation. Increase caliber of the common bile duct measures 1 cm, image 32/8. No common bile duct stone or mass identified. Pancreas: Unremarkable. No pancreatic ductal dilatation or surrounding inflammatory changes. Spleen: Normal in size without focal abnormality. Adrenals/Urinary Tract: Normal adrenal glands. No hydronephrosis or nephrolithiasis. No signs of kidney injury. Exophytic cyst arising off the lateral cortex of the left kidney is unchanged measuring 3.5 cm, image 62/3. Bladder unremarkable. Stomach/Bowel: Stomach is normal. No bowel wall thickening, inflammation, or distension. Distal colonic diverticulosis. Vascular/Lymphatic: Aortic atherosclerosis. No aneurysm. No abdominopelvic adenopathy. Reproductive: Prostate is unremarkable. Other: Small bilateral fat containing inguinal hernias and small umbilical hernia which also contains fat only. No free fluid. Musculoskeletal: Lumbar vertebral body heights are well preserved. No acute or suspicious osseous findings IMPRESSION: 1. Acute left posterior tenth and eleventh rib fractures. No pneumothorax. 2. No acute findings identified within the abdomen or pelvis. 3. Emphysema and aortic atherosclerosis. Coronary artery calcifications noted. Aortic Atherosclerosis (ICD10-I70.0) and Emphysema (ICD10-J43.9). Electronically Signed   By: Signa Kell M.D.   On: 04/22/2021 13:40   DG Chest Port 1 View  Result Date: 04/22/2021 CLINICAL DATA:  Left neck and left flank pain. EXAM: PORTABLE CHEST 1 VIEW COMPARISON:  11/30/2020 FINDINGS: The  heart size and mediastinal contours are within normal limits. Both lungs are clear. The visualized skeletal structures are unremarkable. IMPRESSION: No active disease. Electronically Signed   By: Signa Kell M.D.   On: 04/22/2021 11:39    Procedures Procedures   Medications Ordered in ED Medications  sodium chloride 0.9 % bolus 1,000 mL (0 mLs Intravenous Stopped 04/22/21 1328)    And  0.9 %  sodium chloride infusion ( Intravenous New Bag/Given 04/22/21 1328)  HYDROmorphone (DILAUDID) injection 1 mg (has no administration in time range)  morphine 4 MG/ML injection 4 mg (4 mg Intravenous Given 04/22/21 1101)  ondansetron (ZOFRAN) injection 4 mg (4 mg  Intravenous Given 04/22/21 1101)  HYDROmorphone (DILAUDID) injection 1 mg (1 mg Intravenous Given 04/22/21 1143)  ondansetron (ZOFRAN) injection 4 mg (4 mg Intravenous Given 04/22/21 1310)  iohexol (OMNIPAQUE) 300 MG/ML solution 100 mL (100 mLs Intravenous Contrast Given 04/22/21 1309)    ED Course  I have reviewed the triage vital signs and the nursing notes.  Pertinent labs & imaging results that were available during my care of the patient were reviewed by me and considered in my medical decision making (see chart for details).    MDM Rules/Calculators/A&P                          Pt has 2 rib fx on CT.  He is still in quite a bit of pain and is having a hard time moving even a little bit.  Additional pain meds ordered.  Incentive spirometer ordered.  Pt d/w trauma who will eval for admission.  Final Clinical Impression(s) / ED Diagnoses Final diagnoses:  Fall  Closed fracture of multiple ribs of left side, initial encounter    Rx / DC Orders ED Discharge Orders     None        Jacalyn Lefevre, MD 04/22/21 1437

## 2021-04-23 LAB — CBC
HCT: 36.5 % — ABNORMAL LOW (ref 39.0–52.0)
Hemoglobin: 12.4 g/dL — ABNORMAL LOW (ref 13.0–17.0)
MCH: 32 pg (ref 26.0–34.0)
MCHC: 34 g/dL (ref 30.0–36.0)
MCV: 94.3 fL (ref 80.0–100.0)
Platelets: 291 10*3/uL (ref 150–400)
RBC: 3.87 MIL/uL — ABNORMAL LOW (ref 4.22–5.81)
RDW: 12.8 % (ref 11.5–15.5)
WBC: 6.5 10*3/uL (ref 4.0–10.5)
nRBC: 0 % (ref 0.0–0.2)

## 2021-04-23 LAB — BASIC METABOLIC PANEL
Anion gap: 6 (ref 5–15)
BUN: 12 mg/dL (ref 8–23)
CO2: 23 mmol/L (ref 22–32)
Calcium: 8.4 mg/dL — ABNORMAL LOW (ref 8.9–10.3)
Chloride: 107 mmol/L (ref 98–111)
Creatinine, Ser: 0.86 mg/dL (ref 0.61–1.24)
GFR, Estimated: >60 mL/min (ref >60)
Glucose, Bld: 93 mg/dL (ref 70–99)
Potassium: 3.6 mmol/L (ref 3.5–5.1)
Sodium: 136 mmol/L (ref 135–145)

## 2021-04-23 MED ORDER — ACETAMINOPHEN 500 MG PO TABS: 1000.0000 mg | ORAL_TABLET | Freq: Four times a day (QID) | ORAL | 0 refills | Status: DC | PRN

## 2021-04-23 MED ORDER — ONDANSETRON 4 MG PO TBDP: 4.0000 mg | ORAL_TABLET | Freq: Four times a day (QID) | ORAL | 0 refills | Status: DC | PRN

## 2021-04-23 MED ORDER — METHOCARBAMOL 750 MG PO TABS: 750.0000 mg | ORAL_TABLET | Freq: Three times a day (TID) | ORAL | 0 refills | Status: DC

## 2021-04-23 NOTE — Progress Notes (Signed)
PT Cancellation Note  Patient Details Name: Leonard Rowe MRN: [id number] DOB: 11-Apr-1956   Cancelled Treatment:    Reason Eval/Treat Not Completed: PT screened, no needs identified, patient preparing for discharge; acute PT will sign off. Please reconsult if new needs arise.  Ina Homes, PT, DPT Acute Rehabilitation Services  Pager [phone] Office [phone]  Malachy Chamber 04/23/2021, 8:54 AM

## 2021-04-23 NOTE — Discharge Summary (Signed)
Patient ID: Leonard Rowe [id number] [birth year] 65 y.o.  Admit date: 04/22/2021 Discharge date: 04/23/2021  Admitting Diagnosis: L 10,11 rib fx Fall on wet steps  Discharge Diagnosis Patient Active Problem List   Diagnosis Date Noted   Left rib fracture 04/22/2021   Insect bite 04/17/2017   Finger swelling     Consultants none  Reason for Admission: This is a 65 yo healthy white male who was at home this morning and slipped and fell down some steps at home because they were wet.  He does not have a fall history.  He denies syncope or any other cause of his fall, except mechanical.  He states that he hit his posterior head or the top of his neck and thinks he may have had a short LOC.  He hit his left side as well as continues to have left posterior and lateral chest pain.  He initially had some pain in his neck and down his left shoulder but this has resolved since being here.  He denies any other pain.  He is currently struggling with pain control as well as nausea secondary to narcotics.  He has the "shakes" upon my entering secondary to medications and pain as he just stood to void.  He underwent trauma scans and his only injuries are L 10-11 rib fractures.  We have been asked to see him for admission for pain control.  He is sating 100% on RA.  Procedures none  Hospital Course:  The patient was admitted for pain control and mobilization.  He did well with both of these things and was stable on HD 1 for Dc home.  Physical Exam: Gen: NAD Heart: regular Lungs: CTAB, pulls 1500 on IS, tender on left chest wall as expected. Abd: soft, NT, ND Ext: MAE Psych A&Ox3  Allergies as of 04/23/2021       Reactions   Codeine Hives, Itching        Medication List     STOP taking these medications    BC HEADACHE POWDER PO   tizanidine 2 MG capsule Commonly known as: Zanaflex       TAKE these medications    acetaminophen 500 MG tablet Commonly known as:  TYLENOL Take 2 tablets (1,000 mg total) by mouth every 6 (six) hours as needed.   amoxicillin 500 MG capsule Commonly known as: AMOXIL Take 1 capsule (500 mg total) by mouth 3 (three) times daily.   cetirizine 10 MG chewable tablet Commonly known as: ZYRTEC Chew 10 mg by mouth 2 (two) times a week.   ibuprofen 800 MG tablet Commonly known as: ADVIL Take 1 tablet (800 mg total) by mouth every 8 (eight) hours as needed for mild pain or moderate pain.   lidocaine 2 % solution Commonly known as: XYLOCAINE Use as directed 15 mLs in the mouth or throat as needed for mouth pain.   methocarbamol 750 MG tablet Commonly known as: ROBAXIN Take 1 tablet (750 mg total) by mouth 3 (three) times daily.   ondansetron 4 MG disintegrating tablet Commonly known as: ZOFRAN-ODT Take 1 tablet (4 mg total) by mouth every 6 (six) hours as needed for nausea.          Follow-up Information     Urgent care Follow up.   Why: As needed        [facility] Follow up today.   Why: May call with questions, but no follow up needed Contact information: [address]  [address] [place] [address] [phone]                Signed: Barnetta Chapel, [facility] 04/23/2021, 8:45 AM Please see Amion for pager number during day hours 7:00am-4:30pm, 7-11:30am on Weekends

## 2022-05-07 ENCOUNTER — Ambulatory Visit (HOSPITAL_COMMUNITY)
Admission: EM | Admit: 2022-05-07 | Discharge: 2022-05-07 | Disposition: A | Discharge status: Home / Self Care | Payer: Self-pay | Attending: Emergency Medicine | Admitting: Emergency Medicine

## 2022-05-07 DIAGNOSIS — L239 Allergic contact dermatitis, unspecified cause: Secondary | ICD-10-CM

## 2022-05-07 DIAGNOSIS — R21 Rash and other nonspecific skin eruption: Secondary | ICD-10-CM

## 2022-05-07 MED ORDER — METHYLPREDNISOLONE SODIUM SUCC 125 MG IJ SOLR
INTRAMUSCULAR | Status: AC
  Filled 2022-05-07: qty 2

## 2022-05-07 MED ORDER — METHYLPREDNISOLONE SODIUM SUCC 125 MG IJ SOLR
60.0000 mg | Freq: Once | INTRAMUSCULAR | Status: AC
  Administered 2022-05-07: 60 mg via INTRAMUSCULAR

## 2022-05-07 MED ORDER — PREDNISONE 20 MG PO TABS: 40.0000 mg | ORAL_TABLET | Freq: Every day | ORAL | 0 refills | Status: AC

## 2022-05-07 NOTE — Discharge Instructions (Addendum)
Take the prednisone pills as prescribed.  I recommend to take with breakfast.  This medicine should help with the rash on your face.  Please return to the urgent care if symptoms persist.  Any worsening symptoms, please go to the emergency department.

## 2022-05-07 NOTE — ED Provider Notes (Signed)
[facility]    CSN: [id number] Arrival date & time: 05/07/22  1324     History   Chief Complaint Chief Complaint  Patient presents with   Rash    HPI Leonard Rowe is a 66 y.o. male.  Presents with rash to the face for 3 days. Not itchy but feels sore. Associated lip and tongue tingling. Feels tongue is swelling. Denies shortness of breath, trouble breathing, abdominal pain, vomiting/diarrhea.  Some recent sun exposure. No change in lotion, soap, detergents, etc  Does have "scalp disorder" that he is prescribed shampoo for - possibly ketoconazole.  No past medical history on file.  Patient Active Problem List   Diagnosis Date Noted   Left rib fracture 04/22/2021   Insect bite 04/17/2017   Finger swelling     Past Surgical History:  Procedure Laterality Date   APPENDECTOMY     CHOLECYSTECTOMY     I & D EXTREMITY Right 04/17/2017   Procedure: IRRIGATION AND DEBRIDEMENT EXTREMITY RIGHT MIDDLE FINGER;  Surgeon: Dairl Ponder, MD;  Location: [facility];  Service: Orthopedics;  Laterality: Right;   KNEE SURGERY     ROTATOR CUFF REPAIR         Home Medications    Prior to Admission medications   Medication Sig Start Date End Date Taking? Authorizing Provider  predniSONE (DELTASONE) 20 MG tablet Take 2 tablets (40 mg total) by mouth daily for 5 days. 05/07/22 05/12/22 Yes Ezio Wieck, Lurena Joiner, PA-C  acetaminophen (TYLENOL) 500 MG tablet Take 2 tablets (1,000 mg total) by mouth every 6 (six) hours as needed. 04/23/21   Barnetta Chapel, PA-C  amoxicillin (AMOXIL) 500 MG capsule Take 1 capsule (500 mg total) by mouth 3 (three) times daily. 04/15/21   Valinda Hoar, NP  cetirizine (ZYRTEC) 10 MG chewable tablet Chew 10 mg by mouth 2 (two) times a week.    [provider]  ibuprofen (ADVIL) 800 MG tablet Take 1 tablet (800 mg total) by mouth every 8 (eight) hours as needed for mild pain or moderate pain. 10/24/19   Eustace Moore, MD  lidocaine (XYLOCAINE) 2  % solution Use as directed 15 mLs in the mouth or throat as needed for mouth pain. 04/15/21   White, Elita Boone, NP  methocarbamol (ROBAXIN) 750 MG tablet Take 1 tablet (750 mg total) by mouth 3 (three) times daily. 04/23/21   Barnetta Chapel, PA-C  ondansetron (ZOFRAN-ODT) 4 MG disintegrating tablet Take 1 tablet (4 mg total) by mouth every 6 (six) hours as needed for nausea. 04/23/21   Barnetta Chapel, PA-C    Family History Family History  Problem Relation Age of Onset   Heart disease Father     Social History Social History   Tobacco Use   Smoking status: Some Days    Packs/day: 0.25    Types: Cigarettes   Smokeless tobacco: Never   Tobacco comments:    Decrease smoking to 1 pack/2 wks  Substance Use Topics   Alcohol use: No   Drug use: No     Allergies   Codeine   Review of Systems Review of Systems  Skin:  Positive for rash.   Per HPI  Physical Exam Triage Vital Signs ED Triage Vitals  Enc Vitals Group     BP 05/07/22 1351 (!) 152/80     Pulse Rate 05/07/22 1351 82     Resp 05/07/22 1351 16     Temp 05/07/22 1351 98.1 F (36.7 C)  Temp Source 05/07/22 1351 Oral     SpO2 05/07/22 1351 94 %     Weight --      Height --      Head Circumference --      Peak Flow --      Pain Score 05/07/22 1353 0     Pain Loc --      Pain Edu? --      Excl. in [place]? --    No data found.  Updated Vital Signs BP (!) 152/80 (BP Location: Left Arm)   Pulse 82   Temp 98.1 F (36.7 C) (Oral)   Resp 16   SpO2 94%   Physical Exam Vitals and nursing note reviewed.  Constitutional:      General: He is not in acute distress. HENT:     Mouth/Throat:     Mouth: Mucous membranes are moist. No angioedema.     Pharynx: Oropharynx is clear. No pharyngeal swelling or posterior oropharyngeal erythema.     Comments: No noted lip or tongue swelling, airway is patent Eyes:     Conjunctiva/sclera: Conjunctivae normal.     Pupils: Pupils are equal, round, and reactive to light.   Cardiovascular:     Rate and Rhythm: Normal rate and regular rhythm.     Pulses: Normal pulses.     Heart sounds: Normal heart sounds.  Pulmonary:     Effort: Pulmonary effort is normal. No respiratory distress.     Breath sounds: Normal breath sounds. No wheezing.  Abdominal:     Tenderness: There is no abdominal tenderness.  Musculoskeletal:        General: Normal range of motion.     Cervical back: Normal range of motion.  Skin:    Findings: Rash present.     Comments: Erythematous, maculopapular rash with few vesicles scattered across the forehead, bilateral cheeks  Neurological:     Mental Status: He is alert and oriented to person, place, and time.     UC Treatments / Results  Labs (all labs ordered are listed, but only abnormal results are displayed) Labs Reviewed - No data to display  EKG  Radiology No results found.  Procedures Procedures   Medications Ordered in UC Medications  methylPREDNISolone sodium succinate (SOLU-MEDROL) 125 mg/2 mL injection 60 mg (60 mg Intramuscular Given 05/07/22 1527)    Initial Impression / Assessment and Plan / UC Course  I have reviewed the triage vital signs and the nursing notes.  Pertinent labs & imaging results that were available during my care of the patient were reviewed by me and considered in my medical decision making (see chart for details).  Rash appears to be some sort of allergic dermatitis with unknown etiology.  Well-appearing without overt signs of anaphylaxis at this time. Solu-Medrol dose given in clinic. Tingling and swelling feeling of the tongue resolved. 40 mg prednisone once daily for the next 5 days. Strict ED precautions.  Will follow-up with primary care provider if symptoms persist, or return to urgent care. Return precautions discussed, patient agrees to plan.  Final Clinical Impressions(s) / UC Diagnoses   Final diagnoses:  Rash  Allergic dermatitis     Discharge Instructions      Take  the prednisone pills as prescribed.  I recommend to take with breakfast.  This medicine should help with the rash on your face.  Please return to the urgent care if symptoms persist.  Any worsening symptoms, please go to the emergency department.  ED Prescriptions     Medication Sig Dispense Auth. Provider   predniSONE (DELTASONE) 20 MG tablet Take 2 tablets (40 mg total) by mouth daily for 5 days. 10 tablet Finlay Mills, Wells Guiles, PA-C      PDMP not reviewed this encounter.   Estefani Bateson, Vernice Jefferson 05/07/22 1538

## 2022-05-07 NOTE — ED Triage Notes (Signed)
Pt presents for rash all over his face x 3-4 days. Pt reports rash is itchy and red.

## 2022-06-09 ENCOUNTER — Other Ambulatory Visit: Payer: Self-pay

## 2022-06-09 ENCOUNTER — Emergency Department (HOSPITAL_COMMUNITY)
Admission: EM | Admit: 2022-06-09 | Discharge: 2022-06-09 | Disposition: A | Discharge status: Home / Self Care | Payer: Self-pay | Attending: Emergency Medicine | Admitting: Emergency Medicine

## 2022-06-09 DIAGNOSIS — L249 Irritant contact dermatitis, unspecified cause: Secondary | ICD-10-CM | POA: Insufficient documentation

## 2022-06-09 DIAGNOSIS — R21 Rash and other nonspecific skin eruption: Secondary | ICD-10-CM

## 2022-06-09 MED ORDER — PREDNISONE 20 MG PO TABS: 40.0000 mg | ORAL_TABLET | Freq: Every day | ORAL | 0 refills | Status: AC

## 2022-06-09 NOTE — Discharge Instructions (Signed)
Today you were seen in the emergency department for your facial rash.    In the emergency department you had an evaluation that was reassuring.    At home, please take the steroid course that we have given you.  Please also use sunscreen for your face in case sun is irritating your rash.    Keep track of any new exposures that you may have to chemicals, lotions, or detergents.  We have placed a dermatology referral for you and they will call you about an appointment.  Please talk to them about payment options.  Please follow-up with a primary doctor in 2 to 3 days regarding your visit as well.  Return immediately to the emergency department if you experience any of the following: Fevers, worsening pain, difficulty breathing, or any other concerning symptoms.    Thank you for visiting our Emergency Department. It was a pleasure taking care of you today.

## 2022-06-09 NOTE — ED Provider Notes (Signed)
[facility] EMERGENCY DEPARTMENT Provider Note   CSN: [id number] Arrival date & time: 06/09/22  0740     History  Chief Complaint  Patient presents with   Facial Swelling   Facial Pain    Leonard Rowe is a 66 y.o. male.  66 year old male previously healthy presents the emergency department with facial swelling and pain.  Patient states that 3 weeks ago he started having facial redness, swelling, and welts on his face that prompted him to come to the emergency department.  He was seen and discharged home with 5-day course of prednisone which resolved his symptoms.  Reports that he started experiencing a similar rash last night with facial tingling.  Says that it does feel itchy.  Denies any severe pain with it.  No fevers.  No rash elsewhere.  No oral involvement but did feel like the left side of his face may have been more swollen than the right.  No new lotions or creams that he has been using on his face.  Does not currently take any medications.  No new chemicals that he is working with at work.  Denies any additional joint pains, abdominal pain, nausea or vomiting, weight loss.        Home Medications Prior to Admission medications   Medication Sig Start Date End Date Taking? Authorizing Provider  predniSONE (DELTASONE) 20 MG tablet Take 2 tablets (40 mg total) by mouth daily for 5 days. 06/09/22 06/14/22 Yes Rondel Baton, MD  acetaminophen (TYLENOL) 500 MG tablet Take 2 tablets (1,000 mg total) by mouth every 6 (six) hours as needed. 04/23/21   Barnetta Chapel, PA-C  amoxicillin (AMOXIL) 500 MG capsule Take 1 capsule (500 mg total) by mouth 3 (three) times daily. 04/15/21   Valinda Hoar, NP  cetirizine (ZYRTEC) 10 MG chewable tablet Chew 10 mg by mouth 2 (two) times a week.    [provider]  ibuprofen (ADVIL) 800 MG tablet Take 1 tablet (800 mg total) by mouth every 8 (eight) hours as needed for mild pain or moderate pain. 10/24/19   Eustace Moore, MD  lidocaine (XYLOCAINE) 2 % solution Use as directed 15 mLs in the mouth or throat as needed for mouth pain. 04/15/21   White, Elita Boone, NP  methocarbamol (ROBAXIN) 750 MG tablet Take 1 tablet (750 mg total) by mouth 3 (three) times daily. 04/23/21   Barnetta Chapel, PA-C  ondansetron (ZOFRAN-ODT) 4 MG disintegrating tablet Take 1 tablet (4 mg total) by mouth every 6 (six) hours as needed for nausea. 04/23/21   Barnetta Chapel, PA-C      Allergies    Codeine    Review of Systems   Review of Systems  Physical Exam Updated Vital Signs BP (!) 178/86 (BP Location: Right Arm)   Pulse 62   Temp 97.9 F (36.6 C) (Oral)   Resp 16   Ht 6' (1.829 m)   Wt 76.2 kg   SpO2 100%   BMI 22.78 kg/m  Physical Exam Vitals and nursing note reviewed.  Constitutional:      General: He is not in acute distress.    Appearance: He is well-developed.  HENT:     Head: Normocephalic and atraumatic.     Right Ear: External ear normal.     Left Ear: External ear normal.     Nose: Nose normal.     Mouth/Throat:     Mouth: Mucous membranes are moist.     Pharynx:  Oropharynx is clear. No oropharyngeal exudate or posterior oropharyngeal erythema.     Comments: No tongue swelling Eyes:     Extraocular Movements: Extraocular movements intact.     Conjunctiva/sclera: Conjunctivae normal.     Pupils: Pupils are equal, round, and reactive to light.  Cardiovascular:     Rate and Rhythm: Normal rate and regular rhythm.     Heart sounds: Normal heart sounds.  Pulmonary:     Effort: Pulmonary effort is normal. No respiratory distress.     Breath sounds: Normal breath sounds. No stridor.  Musculoskeletal:        General: No swelling.     Cervical back: Normal range of motion and neck supple.  Skin:    General: Skin is warm and dry.     Capillary Refill: Capillary refill takes less than 2 seconds.     Comments: No rash on the chest, abdomen, upper or lower extremities.  Rash on face see picture  below.  It is blanching.  No vesicles or papules noted.  Neurological:     Mental Status: He is alert. Mental status is at baseline.  Psychiatric:        Mood and Affect: Mood normal.        Behavior: Behavior normal.       ED Results / Procedures / Treatments   Labs (all labs ordered are listed, but only abnormal results are displayed) Labs Reviewed - No data to display  EKG None  Radiology No results found.  Procedures Procedures   Medications Ordered in ED Medications - No data to display  ED Course/ Medical Decision Making/ A&P                           Medical Decision Making Risk Prescription drug management.   Leonard Rowe is a 66 y.o. male who presents with chief complaint of facial rash.  Initial Ddx:  Allergic dermatitis, photosensitivity rash, contact dermatitis, lupus, dermatomyositis  MDM:  Feel the patient symptoms are likely due to allergic dermatitis or contact dermatitis but unclear to the exposure that would be causing this.  Also considered photosensitivity rash given the fact that is only on his face and areas that are exposed to the sun but does not have additional rash in exposed areas such as arms.  Considered systemic causes such as lupus or dermatomyositis but the patient does not have any other systemic symptoms or weakness or joint pain at this time so feel this is less likely.  Plan:  5-day course of steroids Dermatology referral Sunscreen  Dispo: DC Home. Return precautions discussed including, but not limited to, those listed in the AVS. Allowed pt time to ask questions which were answered fully prior to dc.   Records reviewed  Prior ER visit notes Social Determinants Uninsured   Final Clinical Impression(s) / ED Diagnoses Final diagnoses:  Irritant contact dermatitis, unspecified trigger  Facial rash    Rx / DC Orders ED Discharge Orders          Ordered    predniSONE (DELTASONE) 20 MG tablet  Daily        06/09/22  0844    Ambulatory referral to Dermatology       Comments: Facial rash   06/09/22 0844              Rondel Baton, MD 06/09/22 [phone]

## 2022-06-09 NOTE — ED Triage Notes (Signed)
Pt. Stated, I was here 3 weeks ago for facial swelling and tingling and they gave me some steroids and it went away . It started bck again yesterday evening.  My face feels like a pin cushion.

## 2022-12-09 ENCOUNTER — Encounter (HOSPITAL_COMMUNITY): Payer: Self-pay | Admitting: *Deleted

## 2022-12-09 ENCOUNTER — Ambulatory Visit (HOSPITAL_COMMUNITY)
Admission: EM | Admit: 2022-12-09 | Discharge: 2022-12-09 | Disposition: A | Discharge status: Home / Self Care | Payer: Medicare Other | Attending: Family Medicine | Admitting: Family Medicine

## 2022-12-09 DIAGNOSIS — R21 Rash and other nonspecific skin eruption: Secondary | ICD-10-CM | POA: Diagnosis not present

## 2022-12-09 MED ORDER — CEPHALEXIN 500 MG PO CAPS: 500.0000 mg | ORAL_CAPSULE | Freq: Three times a day (TID) | ORAL | 0 refills | Status: AC

## 2022-12-09 MED ORDER — VALACYCLOVIR HCL 1 G PO TABS: 1000.0000 mg | ORAL_TABLET | Freq: Three times a day (TID) | ORAL | 0 refills | Status: AC

## 2022-12-09 NOTE — ED Provider Notes (Signed)
[name]    CSN: [id number] Arrival date & time: 12/09/22  1412      History   Chief Complaint Chief Complaint  Patient presents with   Insect Bite    HPI Leonard Rowe is a 67 y.o. male.   He got bit behind the right ear last Saturday.  He noted a lump that was itching. The next day it opened and drained.  Several days ago he noticed numbness at the right side of the face, cheek area.  Swelling into the jaw line.  He noted two more bumps at the jaw line;  tender;  He does have a slight headache.  Slight right ear pain.  No blurry vision.  No fevers.   Slight nausea.  He has had shingles x 2 in the past, one on his abdomen and another on his scalp.        History reviewed. No pertinent past medical history.  Patient Active Problem List   Diagnosis Date Noted   Left rib fracture 04/22/2021   Insect bite 04/17/2017   Finger swelling     Past Surgical History:  Procedure Laterality Date   APPENDECTOMY     CHOLECYSTECTOMY     I & D EXTREMITY Right 04/17/2017   Procedure: IRRIGATION AND DEBRIDEMENT EXTREMITY RIGHT MIDDLE FINGER;  Surgeon: Charlotte Crumb, MD;  Location: [place];  Service: Orthopedics;  Laterality: Right;   KNEE SURGERY     ROTATOR CUFF REPAIR         Home Medications    Prior to Admission medications   Medication Sig Start Date End Date Taking? Authorizing Provider  acetaminophen (TYLENOL) 500 MG tablet Take 2 tablets (1,000 mg total) by mouth every 6 (six) hours as needed. 04/23/21   Saverio Danker, PA-C  amoxicillin (AMOXIL) 500 MG capsule Take 1 capsule (500 mg total) by mouth 3 (three) times daily. 04/15/21   Hans Eden, NP  cetirizine (ZYRTEC) 10 MG chewable tablet Chew 10 mg by mouth 2 (two) times a week.    [provider]  ibuprofen (ADVIL) 800 MG tablet Take 1 tablet (800 mg total) by mouth every 8 (eight) hours as needed for mild pain or moderate pain. 10/24/19   Raylene Everts, MD  lidocaine (XYLOCAINE)  2 % solution Use as directed 15 mLs in the mouth or throat as needed for mouth pain. 04/15/21   White, Leitha Schuller, NP  methocarbamol (ROBAXIN) 750 MG tablet Take 1 tablet (750 mg total) by mouth 3 (three) times daily. 04/23/21   Saverio Danker, PA-C  ondansetron (ZOFRAN-ODT) 4 MG disintegrating tablet Take 1 tablet (4 mg total) by mouth every 6 (six) hours as needed for nausea. 04/23/21   Saverio Danker, PA-C    Family History Family History  Problem Relation Age of Onset   Heart disease Father     Social History Social History   Tobacco Use   Smoking status: Former    Packs/day: 0.25    Types: Cigarettes   Smokeless tobacco: Never   Tobacco comments:    Decrease smoking to 1 pack/2 wks  Vaping Use   Vaping Use: Never used  Substance Use Topics   Alcohol use: Yes   Drug use: Never     Allergies   Codeine   Review of Systems Review of Systems  Constitutional: Negative.   HENT: Negative.    Respiratory: Negative.    Cardiovascular: Negative.   Gastrointestinal: Negative.   Skin:  Positive for rash.  Physical Exam Triage Vital Signs ED Triage Vitals  Enc Vitals Group     BP 12/09/22 1526 (!) 180/91     Pulse Rate 12/09/22 1526 (!) 59     Resp 12/09/22 1526 16     Temp 12/09/22 1526 97.9 F (36.6 C)     Temp Source 12/09/22 1526 Oral     SpO2 12/09/22 1526 95 %     Weight --      Height --      Head Circumference --      Peak Flow --      Pain Score 12/09/22 1524 6     Pain Loc --      Pain Edu? --      Excl. in [place]? --    No data found.  Updated Vital Signs BP (!) 180/91 (BP Location: Left Arm)   Pulse (!) 59   Temp 97.9 F (36.6 C) (Oral)   Resp 16   SpO2 95%   Visual Acuity Right Eye Distance:   Left Eye Distance:   Bilateral Distance:    Right Eye Near:   Left Eye Near:    Bilateral Near:     Physical Exam Constitutional:      Appearance: Normal appearance.  HENT:     Right Ear: Tympanic membrane normal.     Left Ear: Tympanic  membrane normal.     Nose: Nose normal.  Cardiovascular:     Rate and Rhythm: Normal rate.  Pulmonary:     Effort: Pulmonary effort is normal.  Musculoskeletal:     Cervical back: Normal range of motion and neck supple. No tenderness.  Lymphadenopathy:     Cervical: No cervical adenopathy.  Skin:    Comments: Behind the right ear are several crusted over lesions;  no drainage.  Along the right low jaw there are two raised, tender lumps;  no erythema or warmth;  there are small crusted lesions at both lumps;    Neurological:     Mental Status: He is alert.     Comments: Slight decreased sensation to touch at the right check compared to the left  Psychiatric:        Mood and Affect: Mood normal.      UC Treatments / Results  Labs (all labs ordered are listed, but only abnormal results are displayed) Labs Reviewed - No data to display  EKG   Radiology No results found.  Procedures Procedures (including critical care time)  Medications Ordered in UC Medications - No data to display  Initial Impression / Assessment and Plan / UC Course  I have reviewed the triage vital signs and the nursing notes.  Pertinent labs & imaging results that were available during my care of the patient were reviewed by me and considered in my medical decision making (see chart for details).  Patient seen today for rash at his face.  I am concerned for shingles given the numbness and lesions, but with the raised lumps also concerned for bacterial infection.  Will treat with both today.  Follow up if not improving as expected.   Final Clinical Impressions(s) / UC Diagnoses   Final diagnoses:  Rash and nonspecific skin eruption     Discharge Instructions      You were seen today for rash.  I am treating you with an antiviral today as well as as antibiotic.  You may trial a heating pad or ice pack for comfort.  Tylenol and motrin for pain.  Please return or go to the ER if not improving or  worsening.      ED Prescriptions     Medication Sig Dispense Auth. Provider   valACYclovir (VALTREX) 1000 MG tablet Take 1 tablet (1,000 mg total) by mouth 3 (three) times daily for 7 days. 21 tablet Gwendolen Hewlett, MD   cephALEXin (KEFLEX) 500 MG capsule Take 1 capsule (500 mg total) by mouth 3 (three) times daily for 7 days. 21 capsule Rondel Oh, MD      PDMP not reviewed this encounter.   Rondel Oh, MD 12/09/22 1550

## 2022-12-09 NOTE — Discharge Instructions (Signed)
You were seen today for rash.  I am treating you with an antiviral today as well as as antibiotic.  You may trial a heating pad or ice pack for comfort.  Tylenol and motrin for pain.  Please return or go to the ER if not improving or worsening.

## 2022-12-09 NOTE — ED Triage Notes (Signed)
Pt states he thinks something bit him behind his right ear about a  week ago. Since bit happened the right side of his face is swelling and starting to go numb. He did clean it with peroxide and alchol. He noticed some drainage from the spot behind his ear a couple days ago but it has since stopped.

## 2023-02-21 ENCOUNTER — Emergency Department (HOSPITAL_COMMUNITY): Payer: Medicare Other

## 2023-02-21 ENCOUNTER — Other Ambulatory Visit: Payer: Self-pay

## 2023-02-21 ENCOUNTER — Emergency Department (HOSPITAL_BASED_OUTPATIENT_CLINIC_OR_DEPARTMENT_OTHER): Payer: Medicare Other

## 2023-02-21 ENCOUNTER — Emergency Department (HOSPITAL_COMMUNITY)
Admission: EM | Admit: 2023-02-21 | Discharge: 2023-02-21 | Disposition: A | Discharge status: Home / Self Care | Payer: Medicare Other | Attending: Emergency Medicine | Admitting: Emergency Medicine

## 2023-02-21 DIAGNOSIS — M25561 Pain in right knee: Secondary | ICD-10-CM | POA: Diagnosis present

## 2023-02-21 DIAGNOSIS — M7989 Other specified soft tissue disorders: Secondary | ICD-10-CM

## 2023-02-21 MED ORDER — KETOROLAC TROMETHAMINE 60 MG/2ML IM SOLN
30.0000 mg | Freq: Once | INTRAMUSCULAR | Status: AC
  Administered 2023-02-21: 30 mg via INTRAMUSCULAR
  Filled 2023-02-21: qty 2

## 2023-02-21 MED ORDER — METHYLPREDNISOLONE 4 MG PO TBPK: ORAL_TABLET | ORAL | 0 refills | Status: DC

## 2023-02-21 MED ORDER — DEXAMETHASONE SODIUM PHOSPHATE 10 MG/ML IJ SOLN
10.0000 mg | Freq: Once | INTRAMUSCULAR | Status: AC
  Administered 2023-02-21: 10 mg via INTRAMUSCULAR
  Filled 2023-02-21: qty 1

## 2023-02-21 MED ORDER — NAPROXEN 375 MG PO TABS: 375.0000 mg | ORAL_TABLET | Freq: Two times a day (BID) | ORAL | 0 refills | Status: DC

## 2023-02-21 NOTE — ED Provider Triage Note (Signed)
Emergency Medicine Provider Triage Evaluation Note  Leonard Rowe , a 67 y.o. male  was evaluated in triage.  Pt complains of Left leg pain. Hx of arthritisi in the L knee. Pain and swelling behind the knee. Also ppain in L hip radiating to the front and back of the thigh and calf.  Review of Systems  Positive: Left leg pain  Negative: weakness  Physical Exam  BP (!) 160/104 (BP Location: Right Arm)   Pulse (!) 58   Temp (!) 97.3 F (36.3 C) (Oral)   Resp 16   Ht 6' (1.829 m)   Wt 76.2 kg   SpO2 100%   BMI 22.78 kg/m  Gen:   Awake, no distress   Resp:  Normal effort  MSK:   Moves extremities without difficulty  Other:  Fullness in pop fossa. Limping. Eqaul temp and BL DP/PT pulse  Medical Decision Making  Medically screening exam initiated at 12:00 PM.  Appropriate orders placed.  Leonard Rowe was informed that the remainder of the evaluation will be completed by another provider, this initial triage assessment does not replace that evaluation, and the importance of remaining in the ED until their evaluation is complete.     Arthor Captain, PA-C 02/21/23 1202

## 2023-02-21 NOTE — Discharge Instructions (Signed)
General instructions Take over-the-counter and prescription medicines only as told by your health care provider. Do not use any products that contain nicotine or tobacco, such as cigarettes, e-cigarettes, and chewing tobacco. If you need help quitting, ask your health care provider. If you are overweight, work with your health care provider and a dietitian to set a weight-loss goal that is healthy and reasonable for you. Extra weight can put pressure on your knee. Pay attention to any changes in your symptoms. Keep all follow-up visits. This is important. Contact a health care provider if: Your knee pain continues, changes, or gets worse. You have a fever along with knee pain. Your knee feels warm to the touch or is red. Your knee buckles or locks up. Get help right away if: Your knee swells, and the swelling becomes worse. You cannot move your knee. You have severe pain in your knee that cannot be managed with pain medicine.

## 2023-02-21 NOTE — ED Provider Notes (Signed)
[place] [facility] AT [facility] Provider Note   CSN: [id number] Arrival date & time: 02/21/23  1119     History  Chief Complaint  Patient presents with   Leg Pain    Leonard Rowe is a 67 y.o. malecomplains of Left leg pain. Hx of arthritis n the L knee.  He has mechanical symptoms of locking and locking and occasional instability.  He has noticed pain and swelling behind the knee. Also pain in L hip radiating to the front and back of the thigh and calf. Denies weakness, loss of bowel/bladder function or saddle anesthesia.  Denies fever or recent procedures to back.    Leg Pain      Home Medications Prior to Admission medications   Medication Sig Start Date End Date Taking? Authorizing Provider  acetaminophen (TYLENOL) 500 MG tablet Take 2 tablets (1,000 mg total) by mouth every 6 (six) hours as needed. 04/23/21   Barnetta Chapel, PA-C  cetirizine (ZYRTEC) 10 MG chewable tablet Chew 10 mg by mouth 2 (two) times a week.    [provider]  ibuprofen (ADVIL) 800 MG tablet Take 1 tablet (800 mg total) by mouth every 8 (eight) hours as needed for mild pain or moderate pain. 10/24/19   Eustace Moore, MD  lidocaine (XYLOCAINE) 2 % solution Use as directed 15 mLs in the mouth or throat as needed for mouth pain. 04/15/21   White, Elita Boone, NP  methocarbamol (ROBAXIN) 750 MG tablet Take 1 tablet (750 mg total) by mouth 3 (three) times daily. 04/23/21   Barnetta Chapel, PA-C  ondansetron (ZOFRAN-ODT) 4 MG disintegrating tablet Take 1 tablet (4 mg total) by mouth every 6 (six) hours as needed for nausea. 04/23/21   Barnetta Chapel, PA-C      Allergies    Codeine    Review of Systems   Review of Systems  Physical Exam Updated Vital Signs BP (!) 160/104 (BP Location: Right Arm)   Pulse (!) 58   Temp (!) 97.3 F (36.3 C) (Oral)   Resp 16   Ht 6' (1.829 m)   Wt 76.2 kg   SpO2 100%   BMI 22.78 kg/m  Physical Exam Vitals and nursing note  reviewed.  Constitutional:      General: He is not in acute distress.    Appearance: He is well-developed. He is not diaphoretic.  HENT:     Head: Normocephalic and atraumatic.  Eyes:     General: No scleral icterus.    Conjunctiva/sclera: Conjunctivae normal.  Cardiovascular:     Rate and Rhythm: Normal rate and regular rhythm.     Heart sounds: Normal heart sounds.  Pulmonary:     Effort: Pulmonary effort is normal. No respiratory distress.     Breath sounds: Normal breath sounds.  Abdominal:     Palpations: Abdomen is soft.     Tenderness: There is no abdominal tenderness.  Musculoskeletal:     Cervical back: Normal range of motion and neck supple.     Comments: Obvious arthritic changes of the knee.  Antalgic gait with limping.  No obvious effusion.  He has palpable swelling in the popliteal fossa on the left.  DP and PT pulse palpable bilaterally.  Normal strength in the lower extremity  Skin:    General: Skin is warm and dry.  Neurological:     Mental Status: He is alert.  Psychiatric:        Behavior: Behavior normal.  ED Results / Procedures / Treatments   Labs (all labs ordered are listed, but only abnormal results are displayed) Labs Reviewed - No data to display  EKG None  Radiology No results found.  Procedures Procedures    Medications Ordered in ED Medications - No data to display  ED Course/ Medical Decision Making/ A&P                             Medical Decision Making Here with left knee and leg pain.  Differential includes arthritis, Baker's cyst, DVT, hip pain with referral to the knee, lumbar radiculopathy.  After review of all data points I suspect the patient is having osteoarthritis pain in his left knee.  He is very physically active.  He has a history of previous surgery to the knee many years ago.  Patient will be given Toradol and Decadron here, discharged with anti-inflammatories, close outpatient follow-up with orthopedics.  Knee  sleeve given for support, RICE therapy protocol and rest ordered.  Patient understands supportive care measures and outpatient follow-up.  Discussed return precautions.  No evidence of DVT on ultrasound.  Amount and/or Complexity of Data Reviewed Radiology: ordered and independent interpretation performed.    Details: I visualized and interpreted left knee, lumbar spine, and vascular ultrasound of the lower extremity to rule out DVT.  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   Risk Prescription drug management.           Final Clinical Impression(s) / ED Diagnoses Final diagnoses:  Acute pain of right knee    Rx / DC Orders ED Discharge Orders     None         Arthor Captain, PA-C 02/21/23 1924    Ernie Avena, MD 02/22/23 [phone]

## 2023-02-21 NOTE — ED Notes (Signed)
Left knee and lower leg pain x 1 month. Denies trauma. Distal CMS intact.

## 2023-02-21 NOTE — Progress Notes (Signed)
LLE venous duplex has been completed.  Preliminary results given to Arthor Captain, PA-C.   Results can be found under chart review under CV PROC. 02/21/2023 12:52 PM Aleiya Rye RVT, RDMS

## 2023-02-21 NOTE — ED Triage Notes (Signed)
Pt states that approximately one month ago he began having posterior knee pain which has now radiated into his calf and up into his medial thigh. Pt denies known injury and states that over the last three days he's noticed a numbness sensation to his L foot. No hx of blood clots. PMS intact in triage. L leg cool to touch when compared to R.

## 2023-03-31 ENCOUNTER — Other Ambulatory Visit (HOSPITAL_COMMUNITY): Payer: Self-pay | Admitting: Surgery

## 2023-03-31 DIAGNOSIS — R1011 Right upper quadrant pain: Secondary | ICD-10-CM

## 2023-04-24 ENCOUNTER — Ambulatory Visit (HOSPITAL_COMMUNITY)
Admission: RE | Admit: 2023-04-24 | Discharge: 2023-04-24 | Disposition: A | Discharge status: Home / Self Care | Payer: Medicare HMO | Source: Ambulatory Visit | Attending: Surgery | Admitting: Surgery

## 2023-04-24 DIAGNOSIS — K7689 Other specified diseases of liver: Secondary | ICD-10-CM | POA: Diagnosis not present

## 2023-04-24 DIAGNOSIS — R1011 Right upper quadrant pain: Secondary | ICD-10-CM | POA: Diagnosis not present

## 2023-04-24 MED ORDER — IOHEXOL 350 MG/ML SOLN
75.0000 mL | Freq: Once | INTRAVENOUS | Status: AC | PRN
  Administered 2023-04-24: 75 mL via INTRAVENOUS

## 2023-06-13 DIAGNOSIS — R1011 Right upper quadrant pain: Secondary | ICD-10-CM | POA: Diagnosis not present

## 2023-06-13 DIAGNOSIS — R634 Abnormal weight loss: Secondary | ICD-10-CM | POA: Diagnosis not present

## 2023-06-13 DIAGNOSIS — R110 Nausea: Secondary | ICD-10-CM | POA: Diagnosis not present

## 2023-06-13 DIAGNOSIS — Z1211 Encounter for screening for malignant neoplasm of colon: Secondary | ICD-10-CM | POA: Diagnosis not present

## 2023-06-19 DIAGNOSIS — D125 Benign neoplasm of sigmoid colon: Secondary | ICD-10-CM | POA: Diagnosis not present

## 2023-06-19 DIAGNOSIS — K5730 Diverticulosis of large intestine without perforation or abscess without bleeding: Secondary | ICD-10-CM | POA: Diagnosis not present

## 2023-06-19 DIAGNOSIS — Z1211 Encounter for screening for malignant neoplasm of colon: Secondary | ICD-10-CM | POA: Diagnosis not present

## 2023-06-19 DIAGNOSIS — K635 Polyp of colon: Secondary | ICD-10-CM | POA: Diagnosis not present

## 2023-06-19 DIAGNOSIS — R1011 Right upper quadrant pain: Secondary | ICD-10-CM | POA: Diagnosis not present

## 2023-06-19 DIAGNOSIS — R634 Abnormal weight loss: Secondary | ICD-10-CM | POA: Diagnosis not present

## 2023-06-19 DIAGNOSIS — K648 Other hemorrhoids: Secondary | ICD-10-CM | POA: Diagnosis not present

## 2023-06-19 DIAGNOSIS — K219 Gastro-esophageal reflux disease without esophagitis: Secondary | ICD-10-CM | POA: Diagnosis not present

## 2023-06-19 DIAGNOSIS — K2950 Unspecified chronic gastritis without bleeding: Secondary | ICD-10-CM | POA: Diagnosis not present

## 2023-07-19 ENCOUNTER — Ambulatory Visit (HOSPITAL_COMMUNITY)
Admission: EM | Admit: 2023-07-19 | Discharge: 2023-07-19 | Discharge status: Against Medical Advice | Payer: Medicare PPO

## 2023-07-19 NOTE — ED Notes (Signed)
Per patient registration patient LWBS because he did not want to fill out the dog bite form.

## 2023-08-01 DIAGNOSIS — K5730 Diverticulosis of large intestine without perforation or abscess without bleeding: Secondary | ICD-10-CM | POA: Diagnosis not present

## 2023-08-01 DIAGNOSIS — R1011 Right upper quadrant pain: Secondary | ICD-10-CM | POA: Diagnosis not present

## 2023-08-01 DIAGNOSIS — K219 Gastro-esophageal reflux disease without esophagitis: Secondary | ICD-10-CM | POA: Diagnosis not present

## 2023-08-04 NOTE — Plan of Care (Signed)
 CHL Tonsillectomy/Adenoidectomy, Postoperative PEDS care plan entered in error.

## 2023-08-18 DIAGNOSIS — M150 Primary generalized (osteo)arthritis: Secondary | ICD-10-CM | POA: Diagnosis not present

## 2023-08-18 DIAGNOSIS — Z136 Encounter for screening for cardiovascular disorders: Secondary | ICD-10-CM | POA: Diagnosis not present

## 2023-08-18 DIAGNOSIS — E46 Unspecified protein-calorie malnutrition: Secondary | ICD-10-CM | POA: Diagnosis not present

## 2023-08-18 DIAGNOSIS — K219 Gastro-esophageal reflux disease without esophagitis: Secondary | ICD-10-CM | POA: Diagnosis not present

## 2023-08-18 DIAGNOSIS — Z114 Encounter for screening for human immunodeficiency virus [HIV]: Secondary | ICD-10-CM | POA: Diagnosis not present

## 2023-08-18 DIAGNOSIS — Z1159 Encounter for screening for other viral diseases: Secondary | ICD-10-CM | POA: Diagnosis not present

## 2023-08-18 DIAGNOSIS — K449 Diaphragmatic hernia without obstruction or gangrene: Secondary | ICD-10-CM | POA: Diagnosis not present

## 2023-08-18 DIAGNOSIS — Z79899 Other long term (current) drug therapy: Secondary | ICD-10-CM | POA: Diagnosis not present

## 2023-08-18 DIAGNOSIS — Z125 Encounter for screening for malignant neoplasm of prostate: Secondary | ICD-10-CM | POA: Diagnosis not present

## 2023-08-29 DIAGNOSIS — K449 Diaphragmatic hernia without obstruction or gangrene: Secondary | ICD-10-CM | POA: Diagnosis not present

## 2023-08-29 DIAGNOSIS — E46 Unspecified protein-calorie malnutrition: Secondary | ICD-10-CM | POA: Diagnosis not present

## 2023-08-29 DIAGNOSIS — I77810 Thoracic aortic ectasia: Secondary | ICD-10-CM | POA: Diagnosis not present

## 2023-08-29 DIAGNOSIS — K219 Gastro-esophageal reflux disease without esophagitis: Secondary | ICD-10-CM | POA: Diagnosis not present

## 2023-08-29 DIAGNOSIS — M47816 Spondylosis without myelopathy or radiculopathy, lumbar region: Secondary | ICD-10-CM | POA: Diagnosis not present

## 2023-08-29 DIAGNOSIS — J432 Centrilobular emphysema: Secondary | ICD-10-CM | POA: Diagnosis not present

## 2023-08-29 DIAGNOSIS — Z0001 Encounter for general adult medical examination with abnormal findings: Secondary | ICD-10-CM | POA: Diagnosis not present

## 2023-08-29 DIAGNOSIS — M150 Primary generalized (osteo)arthritis: Secondary | ICD-10-CM | POA: Diagnosis not present

## 2023-08-29 DIAGNOSIS — Z0000 Encounter for general adult medical examination without abnormal findings: Secondary | ICD-10-CM | POA: Diagnosis not present

## 2023-09-15 DIAGNOSIS — M1712 Unilateral primary osteoarthritis, left knee: Secondary | ICD-10-CM | POA: Diagnosis not present

## 2023-09-15 DIAGNOSIS — M25562 Pain in left knee: Secondary | ICD-10-CM | POA: Diagnosis not present

## 2023-12-20 ENCOUNTER — Ambulatory Visit (HOSPITAL_COMMUNITY): Attending: Cardiovascular Disease

## 2023-12-20 ENCOUNTER — Other Ambulatory Visit (HOSPITAL_COMMUNITY): Payer: Self-pay | Admitting: Family Medicine

## 2023-12-20 ENCOUNTER — Encounter (HOSPITAL_COMMUNITY): Payer: Self-pay

## 2023-12-20 DIAGNOSIS — R52 Pain, unspecified: Secondary | ICD-10-CM

## 2023-12-21 ENCOUNTER — Encounter (HOSPITAL_COMMUNITY)

## 2023-12-22 ENCOUNTER — Ambulatory Visit (HOSPITAL_COMMUNITY)
Admission: RE | Admit: 2023-12-22 | Discharge: 2023-12-22 | Disposition: A | Discharge status: Home / Self Care | Source: Ambulatory Visit | Attending: Cardiovascular Disease | Admitting: Cardiovascular Disease

## 2023-12-22 DIAGNOSIS — R52 Pain, unspecified: Secondary | ICD-10-CM | POA: Insufficient documentation

## 2024-02-01 ENCOUNTER — Encounter (HOSPITAL_COMMUNITY): Payer: Self-pay | Admitting: *Deleted

## 2024-02-01 ENCOUNTER — Ambulatory Visit (HOSPITAL_COMMUNITY)
Admission: EM | Admit: 2024-02-01 | Discharge: 2024-02-01 | Disposition: A | Discharge status: Home / Self Care | Attending: Family Medicine | Admitting: Family Medicine

## 2024-02-01 ENCOUNTER — Other Ambulatory Visit: Payer: Self-pay

## 2024-02-01 DIAGNOSIS — J0100 Acute maxillary sinusitis, unspecified: Secondary | ICD-10-CM

## 2024-02-01 DIAGNOSIS — J069 Acute upper respiratory infection, unspecified: Secondary | ICD-10-CM | POA: Diagnosis not present

## 2024-02-01 LAB — POC SARS CORONAVIRUS 2 AG -  ED: SARS Coronavirus 2 Ag: NEGATIVE

## 2024-02-01 MED ORDER — ALBUTEROL SULFATE HFA 108 (90 BASE) MCG/ACT IN AERS: 2.0000 | INHALATION_SPRAY | RESPIRATORY_TRACT | 0 refills | Status: AC | PRN

## 2024-02-01 MED ORDER — AMOXICILLIN-POT CLAVULANATE 875-125 MG PO TABS: 1.0000 | ORAL_TABLET | Freq: Two times a day (BID) | ORAL | 0 refills | Status: AC

## 2024-02-01 NOTE — ED Triage Notes (Signed)
 PT reports he has a cough ,sore throat ,runny eyes 3 days .

## 2024-02-01 NOTE — ED Provider Notes (Signed)
 [facility]    CSN: [id number] Arrival date & time: 02/01/24  4098      History   Chief Complaint Chief Complaint  Patient presents with   Cough    HPI Tauheed Mcfayden Jeziorski is a 68 y.o. male.    Cough Associated symptoms: chills, shortness of breath and sore throat    Patient is here for URI symptoms x 7 days.  Having cough, sore throat, runny eyes.  He has been having chills, having a headache.   He is blowing a lot of phlegm, with chest congestion. He has been having trouble "catching my breath".  He had been using zyrtec, flonase , but not helping.  No known sick contacts.       History reviewed. No pertinent past medical history.  Patient Active Problem List   Diagnosis Date Noted   Left rib fracture 04/22/2021   Insect bite 04/17/2017   Finger swelling     Past Surgical History:  Procedure Laterality Date   APPENDECTOMY     CHOLECYSTECTOMY     I & D EXTREMITY Right 04/17/2017   Procedure: IRRIGATION AND DEBRIDEMENT EXTREMITY RIGHT MIDDLE FINGER;  Surgeon: Florida Hurter, MD;  Location: [facility] OR;  Service: Orthopedics;  Laterality: Right;   KNEE SURGERY     ROTATOR CUFF REPAIR         Home Medications    Prior to Admission medications   Medication Sig Start Date End Date Taking? Authorizing Provider  cetirizine (ZYRTEC) 10 MG chewable tablet Chew 10 mg by mouth 2 (two) times a week.   Yes [provider]  acetaminophen  (TYLENOL ) 500 MG tablet Take 2 tablets (1,000 mg total) by mouth every 6 (six) hours as needed. 04/23/21   Marlin Simmonds, PA-C  ibuprofen  (ADVIL ) 800 MG tablet Take 1 tablet (800 mg total) by mouth every 8 (eight) hours as needed for mild pain or moderate pain. 10/24/19   Stephany Ehrich, MD  lidocaine  (XYLOCAINE ) 2 % solution Use as directed 15 mLs in the mouth or throat as needed for mouth pain. 04/15/21   White, Maybelle Spatz, NP  methocarbamol  (ROBAXIN ) 750 MG tablet Take 1 tablet (750 mg total) by mouth 3 (three) times  daily. 04/23/21   Marlin Simmonds, PA-C  methylPREDNISolone  (MEDROL  DOSEPAK) 4 MG TBPK tablet Use as directed 02/21/23   Harris, Abigail, PA-C  naproxen  (NAPROSYN ) 375 MG tablet Take 1 tablet (375 mg total) by mouth 2 (two) times daily with a meal. 02/21/23   Harris, Abigail, PA-C  ondansetron  (ZOFRAN -ODT) 4 MG disintegrating tablet Take 1 tablet (4 mg total) by mouth every 6 (six) hours as needed for nausea. 04/23/21   Marlin Simmonds, PA-C    Family History Family History  Problem Relation Age of Onset   Heart disease Father     Social History Social History   Tobacco Use   Smoking status: Former    Current packs/day: 0.25    Types: Cigarettes   Smokeless tobacco: Never   Tobacco comments:    Decrease smoking to 1 pack/2 wks  Vaping Use   Vaping status: Never Used  Substance Use Topics   Alcohol use: Yes   Drug use: Never     Allergies   Codeine   Review of Systems Review of Systems  Constitutional:  Positive for chills and fatigue.  HENT:  Positive for congestion, sinus pressure and sore throat.   Respiratory:  Positive for cough and shortness of breath.   Cardiovascular: Negative.  Gastrointestinal: Negative.   Musculoskeletal: Negative.   Psychiatric/Behavioral: Negative.       Physical Exam Triage Vital Signs ED Triage Vitals  Encounter Vitals Group     BP 02/01/24 0912 (!) 163/93     Systolic BP Percentile --      Diastolic BP Percentile --      Pulse Rate 02/01/24 0912 61     Resp 02/01/24 0912 20     Temp 02/01/24 0912 98 F (36.7 C)     Temp src --      SpO2 02/01/24 0912 99 %     Weight --      Height --      Head Circumference --      Peak Flow --      Pain Score 02/01/24 0914 7     Pain Loc --      Pain Education --      Exclude from Growth Chart --    No data found.  Updated Vital Signs BP (!) 163/93   Pulse 61   Temp 98 F (36.7 C)   Resp 20   SpO2 99%   Visual Acuity Right Eye Distance:   Left Eye Distance:   Bilateral  Distance:    Right Eye Near:   Left Eye Near:    Bilateral Near:     Physical Exam Constitutional:      General: He is not in acute distress.    Appearance: Normal appearance. He is normal weight. He is not ill-appearing or toxic-appearing.  HENT:     Nose: Congestion and rhinorrhea present.     Right Sinus: Maxillary sinus tenderness present.     Left Sinus: Maxillary sinus tenderness present.     Mouth/Throat:     Mouth: Mucous membranes are moist.     Pharynx: Oropharynx is clear.  Cardiovascular:     Rate and Rhythm: Normal rate and regular rhythm.  Pulmonary:     Effort: Pulmonary effort is normal.     Breath sounds: Normal breath sounds.  Musculoskeletal:     Cervical back: Normal range of motion and neck supple.  Skin:    General: Skin is warm.  Neurological:     General: No focal deficit present.     Mental Status: He is alert.      UC Treatments / Results  Labs (all labs ordered are listed, but only abnormal results are displayed) Labs Reviewed  POC SARS CORONAVIRUS 2 AG -  ED    EKG   Radiology No results found.  Procedures Procedures (including critical care time)  Medications Ordered in UC Medications - No data to display  Initial Impression / Assessment and Plan / UC Course  I have reviewed the triage vital signs and the nursing notes.  Pertinent labs & imaging results that were available during my care of the patient were reviewed by me and considered in my medical decision making (see chart for details).   Final Clinical Impressions(s) / UC Diagnoses   Final diagnoses:  Acute non-recurrent maxillary sinusitis  Viral URI with cough     Discharge Instructions      You were seen today for upper respiratory symptoms.  Your covid swab was negative today.  I am treating you as a sinus infection with an oral antibiotic.  You may continue zyrtec and flonase  as well.  I have sent out an inhaler for your sensation of shortness of breath as  well.  Please get rest and  fluids.  Follow up if not improving as expected.     ED Prescriptions     Medication Sig Dispense Auth. Provider   amoxicillin -clavulanate (AUGMENTIN ) 875-125 MG tablet Take 1 tablet by mouth every 12 (twelve) hours. 14 tablet Thorn Demas, MD   albuterol  (VENTOLIN  HFA) 108 (90 Base) MCG/ACT inhaler Inhale 2 puffs into the lungs every 4 (four) hours as needed for wheezing or shortness of breath. 1 each Lesle Ras, MD      PDMP not reviewed this encounter.   Lesle Ras, MD 02/01/24 1047

## 2024-02-01 NOTE — Discharge Instructions (Addendum)
 You were seen today for upper respiratory symptoms.  Your covid swab was negative today.  I am treating you as a sinus infection with an oral antibiotic.  You may continue zyrtec and flonase  as well.  I have sent out an inhaler for your sensation of shortness of breath as well.  Please get rest and fluids.  Follow up if not improving as expected.

## 2024-02-07 ENCOUNTER — Ambulatory Visit
Admission: RE | Admit: 2024-02-07 | Discharge: 2024-02-07 | Disposition: A | Discharge status: Home / Self Care | Source: Ambulatory Visit | Attending: Nurse Practitioner | Admitting: Nurse Practitioner

## 2024-02-07 ENCOUNTER — Other Ambulatory Visit: Payer: Self-pay | Admitting: Nurse Practitioner

## 2024-02-07 DIAGNOSIS — R0989 Other specified symptoms and signs involving the circulatory and respiratory systems: Secondary | ICD-10-CM

## 2024-02-07 DIAGNOSIS — R059 Cough, unspecified: Secondary | ICD-10-CM

## 2024-02-07 DIAGNOSIS — R0602 Shortness of breath: Secondary | ICD-10-CM
